# Patient Record
Sex: Female | Born: 1975 | Race: White | Hispanic: No | Marital: Single | Smoking: Former smoker
Health system: Southern US, Community
[De-identification: ages and names within clinical notes are randomized; demographics above are authoritative.]

## PROBLEM LIST (undated history)

## (undated) DIAGNOSIS — N809 Endometriosis, unspecified: Secondary | ICD-10-CM

## (undated) DIAGNOSIS — K589 Irritable bowel syndrome without diarrhea: Secondary | ICD-10-CM

## (undated) DIAGNOSIS — F419 Anxiety disorder, unspecified: Secondary | ICD-10-CM

## (undated) HISTORY — PX: NECK SURGERY: SHX720

---

## 2020-02-28 ENCOUNTER — Other Ambulatory Visit: Payer: Self-pay

## 2020-02-28 ENCOUNTER — Ambulatory Visit: Payer: BC Managed Care – PPO | Attending: Neurosurgery | Admitting: Physical Therapy

## 2020-02-28 ENCOUNTER — Encounter: Payer: Self-pay | Admitting: Physical Therapy

## 2020-02-28 DIAGNOSIS — G8929 Other chronic pain: Secondary | ICD-10-CM | POA: Insufficient documentation

## 2020-02-28 DIAGNOSIS — M542 Cervicalgia: Secondary | ICD-10-CM | POA: Diagnosis not present

## 2020-02-28 DIAGNOSIS — R2681 Unsteadiness on feet: Secondary | ICD-10-CM

## 2020-02-28 DIAGNOSIS — M62838 Other muscle spasm: Secondary | ICD-10-CM

## 2020-02-28 DIAGNOSIS — M545 Low back pain, unspecified: Secondary | ICD-10-CM

## 2020-02-28 NOTE — Therapy (Signed)
[facility] Outpatient Rehabilitation [facility] [address]  [address] [place], [place], [address] Phone: [phone]   Fax:  [phone]  Physical Therapy Evaluation  Patient Details  Name: Cindy Hunt MRN: [id number] Date of Birth: August 28, 1976 Referring Provider (PT): Peggye Ley, MD   Encounter Date: 02/28/2020  PT End of Session - 02/28/20 1416    Visit Number  1    Number of Visits  13    Date for PT Re-Evaluation  04/10/20    Authorization Type  [employer]    PT Start Time  1316    PT Stop Time  1403    PT Time Calculation (min)  47 min    Activity Tolerance  Patient tolerated treatment well    Behavior During Therapy  [facility] for tasks assessed/performed       History reviewed. No pertinent past medical history.  History reviewed. No pertinent surgical history.  There were no vitals filed for this visit.   Subjective Assessment - 02/28/20 1319    Subjective  Patient reports undergoing C5-6 total disc arthroplasty, anterior approach with discectomy on 02/01/19. MD gave no restrictions besides no manipulations such as what a chiropractor would do. Pre-operative symptoms included neck pain and unsteadiness on feet for the past 1/5 years as well as episodes when she felt like she was going to pass out. Post-op she describes another episode of pre-syncope, dizziness, and diaphoresis while driving. Also notes that she felt like she was being pulled to one side. Noticed N/T in B UEs and felt like she was unable to push down on the accelerator with as much force. Has seen many different specialists to figure out what is going on, including an ENT who cleared her for vestibular issues. Looking down for long periods makes her neck feel tight. Has B LBP with prolonged standing which has been an issue for several years. Notes that she still "feels drunk" while walking but notes that she does not appear to look unsteady.    Limitations   Sitting;Reading;Lifting;Standing;Walking;House hold activities    Diagnostic tests  none recent    Patient Stated Goals  "work on stretching"    Currently in Pain?  Yes    Pain Score  7     Pain Location  Neck    Pain Orientation  Right;Left    Pain Descriptors / Indicators  Tightness    Pain Type  Acute pain;Surgical pain    Multiple Pain Sites  Yes    Pain Score  0    Pain Location  Back    Pain Orientation  Left;Right;Lower    Pain Descriptors / Indicators  Aching;Tightness    Pain Type  Chronic pain         [facility] PT Assessment - 02/28/20 1329      Assessment   Medical Diagnosis  Crrvicalgia, chronic LBP    Referring Provider (PT)  Peggye Ley, MD    Onset Date/Surgical Date  02/02/20    Hand Dominance  Right    Next MD Visit  04/21/20    Prior Therapy  yes- pelvic rehab      Precautions   Precautions  None      Balance Screen   Has the patient fallen in the past 6 months  No    Has the patient had a decrease in activity level because of a fear of falling?   No    Is the patient reluctant to leave their home because  of a fear of falling?   No      Home Nurse, mental health  Private residence    Living Arrangements  Parent    Available Help at Discharge  Family    Type of Home  House    Home Access  Stairs to enter    Entrance Stairs-Number of Steps  4    Home Layout  Two level    Alternate Level Stairs-Number of Steps  20    Alternate Level Stairs-Rails  Right    Home Equipment  None      Prior Function   Level of Independence  Independent    Vocation  Full time employment    Vocation Requirements  working from home    Leisure  walking, biking, driving      Cognition   Overall Cognitive Status  Within Functional Limits for tasks assessed      Sensation   Light Touch  Appears Intact   intermittent N/T in B UE/LEs in certain positions     Coordination   Gross Motor Movements are Fluid and Coordinated  Yes      Posture/Postural Control    Posture/Postural Control  Postural limitations    Postural Limitations  Rounded Shoulders;Forward head    Posture Comments  dowager's hump over upper thoracic spine      ROM / Strength   AROM / PROM / Strength  AROM;Strength      AROM   AROM Assessment Site  Cervical;Lumbar    Cervical Flexion  28   severe pain   Cervical Extension  40   severe pain ;4dizziness   Cervical - Right Side Bend  24   severe pain   Cervical - Left Side Bend  30   severe pain   Cervical - Right Rotation  49   severe pain   Cervical - Left Rotation  56   severe pain   Lumbar Flexion  distal shin   severe pain   Lumbar Extension  mildly limited   severe pain   Lumbar - Right Side Bend  distal thigh   severe pain   Lumbar - Left Side Bend  distal thigh   severe pain   Lumbar - Right Rotation  WNL   severe pain   Lumbar - Left Rotation  WNL   severe pain     Strength   Strength Assessment Site  Shoulder;Hip;Knee;Ankle    Right/Left Shoulder  Right;Left    Right Shoulder Flexion  4+/5    Right Shoulder ABduction  4+/5    Right Shoulder Internal Rotation  4+/5    Right Shoulder External Rotation  4+/5    Left Shoulder Flexion  4+/5    Left Shoulder ABduction  4/5    Left Shoulder Internal Rotation  4+/5    Left Shoulder External Rotation  4+/5    Right/Left Hip  Right;Left    Right Hip Flexion  4+/5    Right Hip ABduction  4/5    Right Hip ADduction  4+/5    Left Hip Flexion  4+/5    Left Hip ABduction  4/5    Left Hip ADduction  4+/5    Right/Left Knee  Right;Left    Right Knee Flexion  4+/5    Right Knee Extension  5/5    Left Knee Flexion  4+/5    Left Knee Extension  4+/5    Right/Left Ankle  Right;Left    Right Ankle Dorsiflexion  4+/5    Right Ankle Plantar Flexion  4+/5    Left Ankle Dorsiflexion  4+/5    Left Ankle Plantar Flexion  4+/5      Flexibility   Soft Tissue Assessment /Muscle Length  yes    Hamstrings  B severely tight    Quadriceps  B moderately tight in mod  thomas    ITB  B WNL    Piriformis  B moderately tight in fig 4      Palpation   Palpation comment  no TTP in LB, tightness along thoracic paraspinals and B buttocks; no TTP along neck and shoulders but considerable tone throughout                Objective measurements completed on examination: See above findings.              PT Education - 02/28/20 1416    Education Details  prognosis, POC, HEP    Person(s) Educated  Patient    Methods  Explanation;Demonstration;Tactile cues;Verbal cues;Handout    Comprehension  Verbalized understanding;Returned demonstration       PT Short Term Goals - 02/28/20 1428      PT SHORT TERM GOAL #1   Title  Patient to be independent with initial HEP.    Time  3    Period  Weeks    Status  New    Target Date  03/20/20        PT Long Term Goals - 02/28/20 1429      PT LONG TERM GOAL #1   Title  Patient to be independent with advanced HEP.    Time  6    Period  Weeks    Status  New    Target Date  04/10/20      PT LONG TERM GOAL #2   Title  Patient to demonstrate cervical and lumbar AROM WFL with mild pain remaining.    Time  6    Period  Weeks    Status  New    Target Date  04/10/20      PT LONG TERM GOAL #3   Title  Patient to demonstrate B mild tightness remaining in B HS, piriformis, and hip flexors/quads.    Time  6    Period  Weeks    Status  New    Target Date  04/10/20      PT LONG TERM GOAL #4   Title  Patient to report tolerance for 1.5 hours of reading or working on computer without increase in neck pain in order to improve work tolerance.    Time  6    Period  Weeks    Status  New    Target Date  04/10/20      PT LONG TERM GOAL #5   Title  Patient to score >22/30 on FGA in order to decrease risk of falls.    Time  6    Period  Weeks    Status  New    Target Date  04/10/20             Plan - 02/28/20 1417    Clinical Impression Statement  Patient is a 44y/o F presenting to OPPT with  c/o neck pain and unsteadiness s/p C5-6 total disc arthroplasty, anterior approach with discectomy on 02/01/19 as well as chronic LBP. Patient now dealing with residual tightness in neck and shoulders, especially when looking down. Also notes remaining unsteadiness on her feet resulting from  pre-op cervical disc rupture with myelopathy. Patient is also dealing with episodes of UE N/T, diaphoresis, dizziness, and pre-syncope with activities that also occurred pre-operatively- MD noting this may go away with healing. LBP occurs bilaterally and worse with standing. Patient today demonstrating limited and painful cervical and lumbar AROM, good overall UE and LE strength, rounded shoulders and forward head posture, decreased flexibility, and increased tone in muscle surrounding neck and shoulders as well as back and buttocks. Patient educated on gentle stretching and postural correction HEP- patient reported understanding. Would benefit from skilled PT services 2x/week for 6 weeks to address aforementioned impairments.    Personal Factors and Comorbidities  Age;Past/Current Experience;Profession;Time since onset of injury/illness/exacerbation    Examination-Activity Limitations  Bend;Squat;Carry;Stand;Hygiene/Grooming;Lift;Locomotion Level;Reach Overhead    Examination-Participation Restrictions  Church;Shop;Community Activity;Driving;Yard Work;Laundry;Meal Prep    Stability/Clinical Decision Making  Stable/Uncomplicated    Clinical Decision Making  Low    Rehab Potential  Good    PT Frequency  2x / week    PT Duration  6 weeks    PT Treatment/Interventions  ADLs/Self Care Home Management;Cryotherapy;Electrical Stimulation;Moist Heat;Balance training;Therapeutic exercise;Therapeutic activities;Functional mobility training;Stair training;Gait training;Ultrasound;Neuromuscular re-education;Patient/family education;Manual techniques;Taping;Energy conservation;Dry needling;Passive range of motion;Scar mobilization     PT Next Visit Plan  reassess HEP; FGA    Consulted and Agree with Plan of Care  Patient       Patient will benefit from skilled therapeutic intervention in order to improve the following deficits and impairments:  Decreased activity tolerance, Decreased strength, Increased fascial restricitons, Impaired UE functional use, Pain, Decreased balance, Increased muscle spasms, Decreased range of motion, Postural dysfunction, Impaired flexibility  Visit Diagnosis: Cervicalgia  Chronic bilateral low back pain without sciatica  Other muscle spasm  Unsteadiness on feet     Problem List There are no problems to display for this patient.    Anette Guarneri, PT, DPT 02/28/20 2:33 PM   [facility] [address]  [address] [place], [place], [address] Phone: [phone]   Fax:  [phone]  Name: Cindy Hunt MRN: [id number] Date of Birth: Dec 03, 1976

## 2020-03-02 ENCOUNTER — Ambulatory Visit: Payer: BC Managed Care – PPO

## 2020-03-02 ENCOUNTER — Other Ambulatory Visit: Payer: Self-pay

## 2020-03-02 DIAGNOSIS — G8929 Other chronic pain: Secondary | ICD-10-CM

## 2020-03-02 DIAGNOSIS — M542 Cervicalgia: Secondary | ICD-10-CM

## 2020-03-02 DIAGNOSIS — M62838 Other muscle spasm: Secondary | ICD-10-CM

## 2020-03-02 DIAGNOSIS — M545 Low back pain, unspecified: Secondary | ICD-10-CM

## 2020-03-02 DIAGNOSIS — R2681 Unsteadiness on feet: Secondary | ICD-10-CM

## 2020-03-02 NOTE — Therapy (Signed)
[facility] [address]  [place], [place], [address] Phone: [phone]   Fax:  [phone]  Physical Therapy Treatment  Patient Details  Name: Cindy Hunt MRN: [id number] Date of Birth: 10/21/1976 Referring Provider (PT): Frederich Cha, MD   Encounter Date: 03/02/2020  PT End of Session - 03/02/20 1321    Visit Number  2    Number of Visits  13    Date for PT Re-Evaluation  04/10/20    Authorization Type  [employer]    PT Start Time  1315    PT Stop Time  1401    PT Time Calculation (min)  46 min    Activity Tolerance  Patient tolerated treatment well    Behavior During Therapy  [facility] for tasks assessed/performed       No past medical history on file.  No past surgical history on file.  There were no vitals filed for this visit.  Subjective Assessment - 03/02/20 1317    Subjective  Pt. reporting primary compaint is R lateral shoulder pain.    Diagnostic tests  none recent    Patient Stated Goals  "work on stretching"    Currently in Pain?  Yes    Pain Score  7     Pain Location  Shoulder    Pain Orientation  Right    Pain Descriptors / Indicators  Tightness    Pain Type  Acute pain;Surgical pain    Pain Radiating Towards  Radiating into R lateral neck    Multiple Pain Sites  Yes    Pain Score  0   up to 10/10 for short periods of time at worst   Pain Orientation  Left;Right;Lower    Pain Descriptors / Indicators  Aching;Tightness    Pain Type  Chronic pain    Aggravating Factors   prolonged standing and prolonged sitting    Pain Relieving Factors  short relief with stretching         [facility] PT Assessment - 03/02/20 0001      Functional Gait  Assessment   Gait assessed   Yes    Gait Level Surface  Walks 20 ft in less than 5.5 sec, no assistive devices, good speed, no evidence for imbalance, normal gait pattern, deviates no more than 6 in outside of the 12 in walkway width.    Change in  Gait Speed  Able to smoothly change walking speed without loss of balance or gait deviation. Deviate no more than 6 in outside of the 12 in walkway width.    Gait with Horizontal Head Turns  Performs head turns smoothly with no change in gait. Deviates no more than 6 in outside 12 in walkway width    Gait with Vertical Head Turns  Performs head turns with no change in gait. Deviates no more than 6 in outside 12 in walkway width.    Gait and Pivot Turn  Pivot turns safely within 3 sec and stops quickly with no loss of balance.    Step Over Obstacle  Is able to step over 2 stacked shoe boxes taped together (9 in total height) without changing gait speed. No evidence of imbalance.    Gait with Narrow Base of Support  Is able to ambulate for 10 steps heel to toe with no staggering.    Gait with Eyes Closed  Walks 20 ft, no assistive devices, good speed, no evidence of imbalance, normal gait pattern, deviates  no more than 6 in outside 12 in walkway width. Ambulates 20 ft in less than 7 sec.    Ambulating Backwards  Walks 20 ft, no assistive devices, good speed, no evidence for imbalance, normal gait    Steps  Alternating feet, no rail.    Total Score  30                   [facility] Adult PT Treatment/Exercise - 03/02/20 0001      Neck Exercises: Seated   Neck Retraction  10 reps;5 secs    Neck Retraction Limitations  Good technique     Other Seated Exercise  Seated scapular retraction 5" x 10 reps       Lumbar Exercises: Stretches   Hip Flexor Stretch  Right;Left;2 reps;30 seconds    Hip Flexor Stretch Limitations  mod thomas pos + strap     Piriformis Stretch  Right;Left;2 reps;30 seconds    Piriformis Stretch Limitations  B     Figure 4 Stretch  2 reps;30 seconds    Figure 4 Stretch Limitations  B    Other Lumbar Stretch Exercise  B figure-4 stretch in chair x 30 sec       Lumbar Exercises: Aerobic   Nustep  Lvl 3, 6 min (LE/UE)      Neck Exercises: Stretches   Upper Trapezius  Stretch  Right;Left;1 rep;30 seconds    Upper Trapezius Stretch Limitations  hand anchored on table                PT Short Term Goals - 03/02/20 1321      PT SHORT TERM GOAL #1   Title  Patient to be independent with initial HEP.    Time  3    Period  Weeks    Status  On-going    Target Date  03/20/20        PT Long Term Goals - 03/02/20 1321      PT LONG TERM GOAL #1   Title  Patient to be independent with advanced HEP.    Time  6    Period  Weeks    Status  On-going      PT LONG TERM GOAL #2   Title  Patient to demonstrate cervical and lumbar AROM WFL with mild pain remaining.    Time  6    Period  Weeks    Status  On-going      PT LONG TERM GOAL #3   Title  Patient to demonstrate B mild tightness remaining in B HS, piriformis, and hip flexors/quads.    Time  6    Period  Weeks    Status  On-going      PT LONG TERM GOAL #4   Title  Patient to report tolerance for 1.5 hours of reading or working on computer without increase in neck pain in order to improve work tolerance.    Time  6    Period  Weeks    Status  On-going      PT LONG TERM GOAL #5   Title  Patient to score >22/30 on FGA in order to decrease risk of falls.    Time  6    Period  Weeks    Status  On-going            Plan - 03/02/20 1407    Clinical Impression Statement  Pt. doing well.  Tolerated HEP review requiring very little correction without increased pain.  Did need adjustment to mod Thomas hip flexor stretch to avoid back pain.  Pt. scoring a 30/30 with FGA demonstrating good stability with dynamic gait tasks.  Does verbalize occasional dizziness when looking down while standing or going from supine<>sitting however did not complain of dizziness during session today.  Ended session pain free.    Rehab Potential  Good    PT Treatment/Interventions  ADLs/Self Care Home Management;Cryotherapy;Electrical Stimulation;Moist Heat;Balance training;Therapeutic exercise;Therapeutic  activities;Functional mobility training;Stair training;Gait training;Ultrasound;Neuromuscular re-education;Patient/family education;Manual techniques;Taping;Energy conservation;Dry needling;Passive range of motion;Scar mobilization    Consulted and Agree with Plan of Care  Patient       Patient will benefit from skilled therapeutic intervention in order to improve the following deficits and impairments:  Decreased activity tolerance, Decreased strength, Increased fascial restricitons, Impaired UE functional use, Pain, Decreased balance, Increased muscle spasms, Decreased range of motion, Postural dysfunction, Impaired flexibility  Visit Diagnosis: Cervicalgia  Chronic bilateral low back pain without sciatica  Other muscle spasm  Unsteadiness on feet     Problem List There are no problems to display for this patient.   Kermit Balo, PTA 03/02/20 6:22 PM   [facility] Outpatient Rehabilitation [facility] [address]  [address] [place], [place], [address] Phone: [phone]   Fax:  [phone]  Name: Schylar Allard MRN: [id number] Date of Birth: [birth year]

## 2020-03-06 ENCOUNTER — Other Ambulatory Visit: Payer: Self-pay

## 2020-03-06 ENCOUNTER — Ambulatory Visit: Payer: BC Managed Care – PPO

## 2020-03-06 DIAGNOSIS — M545 Low back pain, unspecified: Secondary | ICD-10-CM

## 2020-03-06 DIAGNOSIS — M542 Cervicalgia: Secondary | ICD-10-CM

## 2020-03-06 DIAGNOSIS — R2681 Unsteadiness on feet: Secondary | ICD-10-CM

## 2020-03-06 DIAGNOSIS — M62838 Other muscle spasm: Secondary | ICD-10-CM

## 2020-03-06 DIAGNOSIS — G8929 Other chronic pain: Secondary | ICD-10-CM

## 2020-03-06 NOTE — Therapy (Signed)
[facility] [address]  [place], [place], [address] Phone: [phone]   Fax:  [phone]  Physical Therapy Treatment  Patient Details  Name: Cindy Hunt MRN: [id number] Date of Birth: 01-Feb-1976 Referring Provider (PT): Frederich Cha, MD   Encounter Date: 03/06/2020  PT End of Session - 03/06/20 1422    Visit Number  3    Number of Visits  13    Date for PT Re-Evaluation  04/10/20    Authorization Type  [employer]    PT Start Time  1405    PT Stop Time  1500    PT Time Calculation (min)  55 min    Activity Tolerance  Patient tolerated treatment well    Behavior During Therapy  [facility] for tasks assessed/performed       No past medical history on file.  No past surgical history on file.  There were no vitals filed for this visit.  Subjective Assessment - 03/06/20 1413    Subjective  Pt. doing well today.  No new complaints.    Diagnostic tests  none recent    Patient Stated Goals  "work on stretching"    Currently in Pain?  Yes    Pain Score  7    10/10 pain on R lateral neck in mornings   Pain Location  Shoulder    Pain Orientation  Right    Pain Descriptors / Indicators  Tightness    Pain Type  Acute pain;Surgical pain    Pain Radiating Towards  into R lateral neck    Multiple Pain Sites  Yes    Pain Location  Back    Pain Orientation  Left;Right;Lower                       [facility] Adult PT Treatment/Exercise - 03/06/20 0001      Neck Exercises: Machines for Strengthening   Cybex Row  low handes; 10# x 15 reps       Neck Exercises: Theraband   Shoulder Extension  10 reps;Red    Shoulder Extension Limitations  cues for scap. retraction/depression     Rows  10 reps;Red    Rows Limitations  cues for scap. retraction/depression       Lumbar Exercises: Stretches   Passive Hamstring Stretch  Right;Left;2 reps;30 seconds    Passive Hamstring Stretch Limitations  supine with  strap     Single Knee to Chest Stretch  Right;Left;1 rep;30 seconds    Single Knee to Chest Stretch Limitations  oppo knee bent    Piriformis Stretch  Right;Left;2 reps;30 seconds    Piriformis Stretch Limitations  B       Lumbar Exercises: Aerobic   UBE (Upper Arm Bike)  Lvl 1.0 ,3 min forwards, 3 min backwards       Modalities   Modalities  Electrical Stimulation;Moist Heat      Moist Heat Therapy   Number Minutes Moist Heat  15 Minutes    Moist Heat Location  Lumbar Spine      Electrical Stimulation   Electrical Stimulation Location  lumbar spine     Electrical Stimulation Action  IFC    Electrical Stimulation Parameters  80-150Hz , intensity to pt. tolerance, 15'    Electrical Stimulation Goals  Tone;Pain      Neck Exercises: Stretches   Upper Trapezius Stretch  Right;Left;1 rep;30 seconds    Upper Trapezius Stretch Limitations  hand anchored on  table     Levator Stretch  Right;1 rep;30 seconds    Corner Stretch  2 reps;30 seconds    Corner Stretch Limitations  low in doorway     Chest Stretch  --               PT Short Term Goals - 03/02/20 1321      PT SHORT TERM GOAL #1   Title  Patient to be independent with initial HEP.    Time  3    Period  Weeks    Status  On-going    Target Date  03/20/20        PT Long Term Goals - 03/02/20 1321      PT LONG TERM GOAL #1   Title  Patient to be independent with advanced HEP.    Time  6    Period  Weeks    Status  On-going      PT LONG TERM GOAL #2   Title  Patient to demonstrate cervical and lumbar AROM WFL with mild pain remaining.    Time  6    Period  Weeks    Status  On-going      PT LONG TERM GOAL #3   Title  Patient to demonstrate B mild tightness remaining in B HS, piriformis, and hip flexors/quads.    Time  6    Period  Weeks    Status  On-going      PT LONG TERM GOAL #4   Title  Patient to report tolerance for 1.5 hours of reading or working on computer without increase in neck pain in order  to improve work tolerance.    Time  6    Period  Weeks    Status  On-going      PT LONG TERM GOAL #5   Title  Patient to score >22/30 on FGA in order to decrease risk of falls.    Time  6    Period  Weeks    Status  On-going            Plan - 03/06/20 1423    Clinical Impression Statement  Cindy Hunt felt fine after last session.  Progressed postural/scapular strengthening today with cueing required for scapular depression/retraction at times.  Split time in session today between cervical and lumbar focused strengthening and stretching activities.  Ended visit with pt. reporting neck and back pain thus trialed E-stim to lumbar spine with moist heat to lumbar/thoracic/cervical spine in hooklying.  Pt. leaving session reporting relief.    Rehab Potential  Good    PT Treatment/Interventions  ADLs/Self Care Home Management;Cryotherapy;Electrical Stimulation;Moist Heat;Balance training;Therapeutic exercise;Therapeutic activities;Functional mobility training;Stair training;Gait training;Ultrasound;Neuromuscular re-education;Patient/family education;Manual techniques;Taping;Energy conservation;Dry needling;Passive range of motion;Scar mobilization    PT Next Visit Plan  Monitor tolerance to modalities    Consulted and Agree with Plan of Care  Patient       Patient will benefit from skilled therapeutic intervention in order to improve the following deficits and impairments:  Decreased activity tolerance, Decreased strength, Increased fascial restricitons, Impaired UE functional use, Pain, Decreased balance, Increased muscle spasms, Decreased range of motion, Postural dysfunction, Impaired flexibility  Visit Diagnosis: Cervicalgia  Chronic bilateral low back pain without sciatica  Other muscle spasm  Unsteadiness on feet     Problem List There are no problems to display for this patient.   Kermit Balo, PTA 03/06/20 6:21 PM   [facility] [address]  [address]  [address] [place], [place], [address] Phone: [phone]   Fax:  [phone]  Name: Cindy Hunt MRN: [id number] Date of Birth: October 16, 1976

## 2020-03-09 ENCOUNTER — Other Ambulatory Visit: Payer: Self-pay

## 2020-03-09 ENCOUNTER — Ambulatory Visit: Payer: BC Managed Care – PPO

## 2020-03-09 DIAGNOSIS — G8929 Other chronic pain: Secondary | ICD-10-CM

## 2020-03-09 DIAGNOSIS — M542 Cervicalgia: Secondary | ICD-10-CM | POA: Diagnosis not present

## 2020-03-09 DIAGNOSIS — M62838 Other muscle spasm: Secondary | ICD-10-CM

## 2020-03-09 DIAGNOSIS — R2681 Unsteadiness on feet: Secondary | ICD-10-CM

## 2020-03-09 DIAGNOSIS — M545 Low back pain, unspecified: Secondary | ICD-10-CM

## 2020-03-09 NOTE — Therapy (Signed)
[facility] [address]  [place], [place], [address] Phone: [phone]   Fax:  [phone]  Physical Therapy Treatment  Patient Details  Name: Cindy Hunt MRN: [id number] Date of Birth: 04/27/1976 Referring Provider (PT): Frederich Cha, MD   Encounter Date: 03/09/2020  PT End of Session - 03/09/20 1324    Visit Number  4    Number of Visits  13    Date for PT Re-Evaluation  04/10/20    Authorization Type  [employer]    PT Start Time  1316    PT Stop Time  1357    PT Time Calculation (min)  41 min    Activity Tolerance  Patient tolerated treatment well    Behavior During Therapy  [facility] for tasks assessed/performed       No past medical history on file.  No past surgical history on file.  There were no vitals filed for this visit.  Subjective Assessment - 03/09/20 1320    Subjective  Pt. reporting that she has pinpointed her pain levels to proximal R-sided upper neck and lower back "over butt bone".    Diagnostic tests  none recent    Patient Stated Goals  "work on stretching"    Currently in Pain?  Yes    Pain Score  5     Pain Location  Neck    Pain Orientation  Right;Upper    Pain Descriptors / Indicators  Tightness    Pain Type  Acute pain;Surgical pain    Pain Radiating Towards  radiating into R upper shoulder    Pain Onset  More than a month ago    Pain Frequency  Intermittent    Multiple Pain Sites  Yes    Pain Score  7    Pain Location  Back    Pain Orientation  Left;Right;Lower    Pain Descriptors / Indicators  Aching;Tightness    Pain Type  Chronic pain                       [facility] Adult PT Treatment/Exercise - 03/09/20 0001      Neck Exercises: Machines for Strengthening   Cybex Row  low handes; 10# x 15 reps       Lumbar Exercises: Stretches   Single Knee to Chest Stretch  Right;Left;1 rep;30 seconds    Single Knee to Chest Stretch Limitations  oppo knee bent     Lower Trunk Rotation Limitations  5" x 10 rpes       Lumbar Exercises: Aerobic   UBE (Upper Arm Bike)  Lvl 1.0 ,3 min forwards, 3 min backwards       Lumbar Exercises: Supine   Pelvic Tilt  10 reps;5 seconds    Pelvic Tilt Limitations  Cues for proper motion     Dead Bug  10 reps;3 seconds    Dead Bug Limitations  1st set LE only; 2nd set UE/LE from hooklying       Neck Exercises: Stretches   Upper Trapezius Stretch  Right;Left;1 rep;30 seconds    Upper Trapezius Stretch Limitations  hand anchored on table     Levator Stretch  Right;1 rep;30 seconds    Levator Stretch Limitations  hands anchored     Neck Stretch  1 rep;30 seconds    Neck Stretch Limitations  B scalenes stretch x 30 sec     Other Neck Stretches  B SCM stretch x 30  sec              PT Education - 03/09/20 1401    Education Details  HEP update; Deadbug, Cat/Camal    Person(s) Educated  Patient    Methods  Explanation;Demonstration;Verbal cues;Handout    Comprehension  Verbalized understanding;Returned demonstration;Verbal cues required       PT Short Term Goals - 03/09/20 1325      PT SHORT TERM GOAL #1   Title  Patient to be independent with initial HEP.    Time  3    Period  Weeks    Status  Achieved    Target Date  03/20/20        PT Long Term Goals - 03/02/20 1321      PT LONG TERM GOAL #1   Title  Patient to be independent with advanced HEP.    Time  6    Period  Weeks    Status  On-going      PT LONG TERM GOAL #2   Title  Patient to demonstrate cervical and lumbar AROM WFL with mild pain remaining.    Time  6    Period  Weeks    Status  On-going      PT LONG TERM GOAL #3   Title  Patient to demonstrate B mild tightness remaining in B HS, piriformis, and hip flexors/quads.    Time  6    Period  Weeks    Status  On-going      PT LONG TERM GOAL #4   Title  Patient to report tolerance for 1.5 hours of reading or working on computer without increase in neck pain in order to  improve work tolerance.    Time  6    Period  Weeks    Status  On-going      PT LONG TERM GOAL #5   Title  Patient to score >22/30 on FGA in order to decrease risk of falls.    Time  6    Period  Weeks    Status  On-going            Plan - 03/09/20 1326    Clinical Impression Statement  Pt. reporting she is getting to home exercise program 5x/week on days not in therapy.  Denies questions regarding HEP.  STG #1 met.  Primary concern with R-sided neck pain and B lower back pain.  Progressed lumbopelvic ROM and strengthening with focus on abdom. activation as pt. doing heady physical activity with yardwork over last few days at her parents house.  HEP updated with lumbopelvic ROM and strengthening that pt. tolerated well in session.  Ended visit with modalities deferred as pt. pain free to end session.  Pt. reporting she did have good pain relief last session from moist heat and E-stim to B lumbar spine and may consider this for pain relief in coming sessions.  Will also plan to review updated HEP to check for tolerance in coming sessions.    Rehab Potential  Good    PT Treatment/Interventions  ADLs/Self Care Home Management;Cryotherapy;Electrical Stimulation;Moist Heat;Balance training;Therapeutic exercise;Therapeutic activities;Functional mobility training;Stair training;Gait training;Ultrasound;Neuromuscular re-education;Patient/family education;Manual techniques;Taping;Energy conservation;Dry needling;Passive range of motion;Scar mobilization    PT Next Visit Plan  Monitor tolerance to updated HEP; modalties prn for lumbar and neck pain relief; postural/cervical flexibility and strengthening    Consulted and Agree with Plan of Care  Patient       Patient will benefit from skilled therapeutic intervention in  order to improve the following deficits and impairments:  Decreased activity tolerance, Decreased strength, Increased fascial restricitons, Impaired UE functional use, Pain, Decreased  balance, Increased muscle spasms, Decreased range of motion, Postural dysfunction, Impaired flexibility  Visit Diagnosis: Cervicalgia  Chronic bilateral low back pain without sciatica  Other muscle spasm  Unsteadiness on feet     Problem List There are no problems to display for this patient.   Bess Harvest, PTA 03/09/20 2:15 PM   [facility] [address]  [place], [place], [address] Phone: [phone]   Fax:  [phone]  Name: Tanise Russman MRN: [id number] Date of Birth: 1976/12/18

## 2020-03-13 ENCOUNTER — Other Ambulatory Visit: Payer: Self-pay

## 2020-03-13 ENCOUNTER — Ambulatory Visit: Payer: BC Managed Care – PPO

## 2020-03-13 DIAGNOSIS — G8929 Other chronic pain: Secondary | ICD-10-CM

## 2020-03-13 DIAGNOSIS — M545 Low back pain, unspecified: Secondary | ICD-10-CM

## 2020-03-13 DIAGNOSIS — M542 Cervicalgia: Secondary | ICD-10-CM | POA: Diagnosis not present

## 2020-03-13 DIAGNOSIS — R2681 Unsteadiness on feet: Secondary | ICD-10-CM

## 2020-03-13 DIAGNOSIS — M62838 Other muscle spasm: Secondary | ICD-10-CM

## 2020-03-13 NOTE — Therapy (Signed)
[facility] [address]  [place], [place], [address] Phone: [phone]   Fax:  [phone]  Physical Therapy Treatment  Patient Details  Name: Cindy Hunt MRN: [id number] Date of Birth: [birth year] Referring Provider (PT): Frederich Cha, MD   Encounter Date: 03/13/2020  PT End of Session - 03/13/20 1323    Visit Number  5    Number of Visits  13    Date for PT Re-Evaluation  04/10/20    Authorization Type  [employer]    PT Start Time  5852    PT Stop Time  1423    PT Time Calculation (min)  60 min    Activity Tolerance  Patient tolerated treatment well    Behavior During Therapy  [facility] for tasks assessed/performed       History reviewed. No pertinent past medical history.  History reviewed. No pertinent surgical history.  There were no vitals filed for this visit.  Subjective Assessment - 03/13/20 1328    Subjective  Pt reports she saw a massage therapist on Saturday that found tension in her suboccipitals with improvement in vision post suboccipital release, even though it worsened during the release. 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 Her headache is worse today.    Diagnostic tests  none recent    Patient Stated Goals  "work on stretching"    Currently in Pain?  Yes    Pain Score  6     Pain Location  Neck    Pain Orientation  Left;Right;Mid    Pain Descriptors / Indicators  Tightness    Pain Type  Acute pain;Surgical pain    Pain Radiating Towards  radiating pain upward causing HA    Pain Onset  More than a month ago    Pain Frequency  Intermittent    Pain Relieving Factors  Extension         [facility] PT Assessment - 03/13/20 0001      AROM   Cervical Flexion  40    Cervical Extension  40    Cervical - Right Side Bend  31    Cervical - Left Side Bend  31    Cervical - Right Rotation  70    Cervical - Left Rotation  78                   [facility] Adult PT  Treatment/Exercise - 03/13/20 0001      Exercises   Exercises  Neck;Shoulder      Neck Exercises: Seated   Neck Retraction  --   Prone windshield wipers     Neck Exercises: Prone   Neck Retraction  10 reps;3 secs    Neck Retraction Limitations  goal post arms/windshield wipers      Shoulder Exercises: Prone   Retraction  AROM;Strengthening;Both;10 reps;Other (comment)    Retraction Limitations  5" holds, neck relaxed, palms facing down wiith facilitation of scap retraction and depression on mat table      Shoulder Exercises: Stretch   Other Shoulder Stretches  Foam roller thoracic extension 10 x 5"       Manual Therapy   Manual Therapy  Joint mobilization;Soft tissue mobilization;Myofascial release;Manual Traction    Joint Mobilization  UPAs grade II R upper C/S, CPAs and rib rocking grade III upper T/S    Soft tissue mobilization  suboccipital release    Myofascial Release  upper thoracic b/t shoulder blades    Manual  Traction  gentle traction with SO release      Neck Exercises: Stretches   Other Neck Stretches  SO release on FR in H/L             PT Education - 03/13/20 1440    Education Details  HEP update wiht handout; prone cervical retractions with windshield wiper arms, prone scapular retractions, foam roller SO release and T/S extension    Person(s) Educated  Patient    Methods  Explanation;Demonstration;Handout;Verbal cues;Tactile cues    Comprehension  Verbalized understanding;Returned demonstration;Verbal cues required       PT Short Term Goals - 03/09/20 1325      PT SHORT TERM GOAL #1   Title  Patient to be independent with initial HEP.    Time  3    Period  Weeks    Status  Achieved    Target Date  03/20/20        PT Long Term Goals - 03/02/20 1321      PT LONG TERM GOAL #1   Title  Patient to be independent with advanced HEP.    Time  6    Period  Weeks    Status  On-going      PT LONG TERM GOAL #2   Title  Patient to demonstrate  cervical and lumbar AROM WFL with mild pain remaining.    Time  6    Period  Weeks    Status  On-going      PT LONG TERM GOAL #3   Title  Patient to demonstrate B mild tightness remaining in B HS, piriformis, and hip flexors/quads.    Time  6    Period  Weeks    Status  On-going      PT LONG TERM GOAL #4   Title  Patient to report tolerance for 1.5 hours of reading or working on computer without increase in neck pain in order to improve work tolerance.    Time  6    Period  Weeks    Status  On-going      PT LONG TERM GOAL #5   Title  Patient to score >22/30 on FGA in order to decrease risk of falls.    Time  6    Period  Weeks    Status  On-going            Plan - 03/13/20 1441    Clinical Impression Statement  Pt presents with continued c/o tightness with a revelation from her massage therapist that her suboccipitals are connected to her vision. During SO release, pt reports some "buggy-eyed, blurred vision" that returns to baseline/improves following release. Pt tolerated tx well, noting improvement with T/S extension over chair, prone cervical/scapular retractions and SO release as well as supine thoracic extensions over foam roller. Pt educated on possibility of hypertonicity related to need for cervical stabilization, as well as postural deficits and poor cervical posturing. Pt educated to perform HEP and focus on upright posture with alarms set on her phone as reminders and to report to PT/PTA next visit.    Rehab Potential  Good    PT Treatment/Interventions  ADLs/Self Care Home Management;Cryotherapy;Electrical Stimulation;Moist Heat;Balance training;Therapeutic exercise;Therapeutic activities;Functional mobility training;Stair training;Gait training;Ultrasound;Neuromuscular re-education;Patient/family education;Manual techniques;Taping;Energy conservation;Dry needling;Passive range of motion;Scar mobilization    PT Next Visit Plan  Monitor progress from HEP and continue to  progress cervical/core stabilization and strength, as well as thoracic mobility and spinal alignment.    Consulted and Agree  with Plan of Care  Patient       Patient will benefit from skilled therapeutic intervention in order to improve the following deficits and impairments:  Decreased activity tolerance, Decreased strength, Increased fascial restricitons, Impaired UE functional use, Pain, Decreased balance, Increased muscle spasms, Decreased range of motion, Postural dysfunction, Impaired flexibility  Visit Diagnosis: Cervicalgia  Chronic bilateral low back pain without sciatica  Other muscle spasm  Unsteadiness on feet     Problem List There are no problems to display for this patient.   Marcelline Mates, PT, DPT 03/13/2020, 2:47 PM  [facility] [address]  [address] [place], [place], [address] Phone: [phone]   Fax:  [phone]  Name: Taytem Ghattas MRN: [id number] Date of Birth: 05-Feb-1976

## 2020-03-16 ENCOUNTER — Other Ambulatory Visit: Payer: Self-pay

## 2020-03-16 ENCOUNTER — Ambulatory Visit: Payer: BC Managed Care – PPO

## 2020-03-16 DIAGNOSIS — M545 Low back pain, unspecified: Secondary | ICD-10-CM

## 2020-03-16 DIAGNOSIS — G8929 Other chronic pain: Secondary | ICD-10-CM

## 2020-03-16 DIAGNOSIS — M542 Cervicalgia: Secondary | ICD-10-CM

## 2020-03-16 DIAGNOSIS — M62838 Other muscle spasm: Secondary | ICD-10-CM

## 2020-03-16 DIAGNOSIS — R2681 Unsteadiness on feet: Secondary | ICD-10-CM

## 2020-03-16 NOTE — Therapy (Signed)
[facility] [address]  [address] [place], [place], [address] Phone: [phone]   Fax:  [phone]  Physical Therapy Treatment  Patient Details  Name: Cindy Hunt MRN: [id number] Date of Birth: 15-Aug-1976 Referring Provider (PT): Peggye Ley, MD   Encounter Date: 03/16/2020  PT End of Session - 03/16/20 1329    Visit Number  6    Number of Visits  13    Date for PT Re-Evaluation  04/10/20    Authorization Type  [employer]    PT Start Time  1320    PT Stop Time  1400    PT Time Calculation (min)  40 min    Activity Tolerance  Patient tolerated treatment well    Behavior During Therapy  [facility] for tasks assessed/performed       No past medical history on file.  No past surgical history on file.  There were no vitals filed for this visit.  Subjective Assessment - 03/16/20 1327    Subjective  Pt. reporting MD diagnosed her with Vit B and Vit D deficiency.    Diagnostic tests  none recent    Patient Stated Goals  "work on stretching"    Currently in Pain?  No/denies    Pain Score  0-No pain    Multiple Pain Sites  No                       [facility] Adult PT Treatment/Exercise - 03/16/20 0001      Neck Exercises: Machines for Strengthening   UBE (Upper Arm Bike)  Lvl 2.5, 3 min forwards, 3 min backwards       Lumbar Exercises: Machines for Strengthening   Other Lumbar Machine Exercise  B machine pallof press 5# x 10 reps       Lumbar Exercises: Standing   Other Standing Lumbar Exercises  B standing red TB SLS pallof press x 10 reps each       Lumbar Exercises: Sidelying   Other Sidelying Lumbar Exercises  B "open book" stretch 5" x 10 resp       Lumbar Exercises: Quadruped   Madcat/Old Horse  10 reps    Madcat/Old Horse Limitations  cat/camal     Opposite Arm/Leg Raise  10 reps;Right arm/Left leg;Left arm/Right leg    Opposite Arm/Leg Raise Limitations  quadruped       Knee/Hip  Exercises: Standing   SLS with Vectors  B SLS + pallof press with red TB x 10 resp    cues for technique and abdom. bracing      Neck Exercises: Stretches   Corner Stretch  2 reps;30 seconds    Corner Stretch Limitations  mid in doorway single arm each                PT Short Term Goals - 03/09/20 1325      PT SHORT TERM GOAL #1   Title  Patient to be independent with initial HEP.    Time  3    Period  Weeks    Status  Achieved    Target Date  03/20/20        PT Long Term Goals - 03/16/20 1337      PT LONG TERM GOAL #1   Title  Patient to be independent with advanced HEP.    Time  6    Period  Weeks    Status  On-going  PT LONG TERM GOAL #2   Title  Patient to demonstrate cervical and lumbar AROM WFL with mild pain remaining.    Time  6    Period  Weeks    Status  On-going      PT LONG TERM GOAL #3   Title  Patient to demonstrate B mild tightness remaining in B HS, piriformis, and hip flexors/quads.    Time  6    Period  Weeks    Status  On-going      PT LONG TERM GOAL #4   Title  Patient to report tolerance for 1.5 hours of reading or working on computer without increase in neck pain in order to improve work tolerance.    Time  6    Period  Weeks    Status  On-going      PT LONG TERM GOAL #5   Title  Patient to score >22/30 on FGA in order to decrease risk of falls.    Time  6    Period  Weeks    Status  Achieved            Plan - 03/16/20 1336    Clinical Impression Statement  Pt. doing well today.  Notes she is getting a massage tomorrow.  Tolerated progression of lumbopelvic strengthening and stability activities well.  Did not have pain with therex today.    Rehab Potential  Good    PT Treatment/Interventions  ADLs/Self Care Home Management;Cryotherapy;Electrical Stimulation;Moist Heat;Balance training;Therapeutic exercise;Therapeutic activities;Functional mobility training;Stair training;Gait training;Ultrasound;Neuromuscular  re-education;Patient/family education;Manual techniques;Taping;Energy conservation;Dry needling;Passive range of motion;Scar mobilization    PT Next Visit Plan  Monitor progress from HEP and continue to progress cervical/core stabilization and strength, as well as thoracic mobility and spinal alignment.    Consulted and Agree with Plan of Care  Patient       Patient will benefit from skilled therapeutic intervention in order to improve the following deficits and impairments:  Decreased activity tolerance, Decreased strength, Increased fascial restricitons, Impaired UE functional use, Pain, Decreased balance, Increased muscle spasms, Decreased range of motion, Postural dysfunction, Impaired flexibility  Visit Diagnosis: Cervicalgia  Chronic bilateral low back pain without sciatica  Other muscle spasm  Unsteadiness on feet     Problem List There are no problems to display for this patient.   Bess Harvest, PTA 03/16/20 5:26 PM   [facility] [address]  [place], [place], [address] Phone: [phone]   Fax:  [phone]  Name: Cindy Hunt MRN: [id number] Date of Birth: 05/21/1976

## 2020-03-20 ENCOUNTER — Encounter: Payer: Self-pay | Admitting: Physical Therapy

## 2020-03-20 ENCOUNTER — Other Ambulatory Visit: Payer: Self-pay

## 2020-03-20 ENCOUNTER — Ambulatory Visit: Payer: BC Managed Care – PPO | Admitting: Physical Therapy

## 2020-03-20 DIAGNOSIS — G8929 Other chronic pain: Secondary | ICD-10-CM

## 2020-03-20 DIAGNOSIS — R2681 Unsteadiness on feet: Secondary | ICD-10-CM

## 2020-03-20 DIAGNOSIS — M545 Low back pain, unspecified: Secondary | ICD-10-CM

## 2020-03-20 DIAGNOSIS — M542 Cervicalgia: Secondary | ICD-10-CM

## 2020-03-20 DIAGNOSIS — M62838 Other muscle spasm: Secondary | ICD-10-CM

## 2020-03-20 NOTE — Therapy (Signed)
[facility] [address]  [address] [place], [place], [address] Phone: [phone]   Fax:  [phone]  Physical Therapy Treatment  Patient Details  Name: Cindy Hunt MRN: [id number] Date of Birth: 1976/09/29 Referring Provider (PT): Peggye Ley, MD   Encounter Date: 03/20/2020  PT End of Session - 03/20/20 1401    Visit Number  7    Number of Visits  13    Date for PT Re-Evaluation  04/10/20    Authorization Type  [employer]    PT Start Time  1317    PT Stop Time  1409    PT Time Calculation (min)  52 min    Activity Tolerance  Patient tolerated treatment well    Behavior During Therapy  [facility] for tasks assessed/performed       History reviewed. No pertinent past medical history.  History reviewed. No pertinent surgical history.  There were no vitals filed for this visit.  Subjective Assessment - 03/20/20 1318    Subjective  Started back to work today and can tell that she is not sitting right because she feels like the L side of her neck has locked up. LB always feels like "it needs to pop into place." Noting low vitamin B and D levels and is taking supplementation for this.    Diagnostic tests  none recent    Patient Stated Goals  "work on stretching"    Currently in Pain?  Yes    Pain Score  7     Pain Location  Neck    Pain Orientation  Left    Pain Descriptors / Indicators  Tightness   strain   Pain Type  Acute pain;Surgical pain    Pain Score  7    Pain Location  Back    Pain Orientation  Right;Left;Lower    Pain Descriptors / Indicators  Aching;Tightness    Pain Type  Chronic pain                       [facility] Adult PT Treatment/Exercise - 03/20/20 0001      Self-Care   Self-Care  Other Self-Care Comments    Other Self-Care Comments   edu and practice using self-STM using ball on L suboccipitals      Neck Exercises: Seated   Cervical Rotation  Left;10 reps    Cervical  Rotation Limitations  cervical rotation SNAG   cues to avoid pushing into pain; cues for positioning   Other Seated Exercise  R/L suboccipitals self-stretch to tolerance 2x20"    cues to avoid piushing into pain     Lumbar Exercises: Aerobic   Stationary Bike  L2 x 6 min       Modalities   Modalities  Cryotherapy      Cryotherapy   Number Minutes Cryotherapy  10 Minutes    Cryotherapy Location  Cervical    Type of Cryotherapy  Ice pack      Manual Therapy   Manual Therapy  Soft tissue mobilization;Myofascial release    Manual therapy comments  seated    Soft tissue mobilization  STM and IASTM to L UT, LS, cervical paraspinals, and suboccipitals, scalenes-      Myofascial Release  manual TPR to L suboccipitals, scalenes, LS             PT Education - 03/20/20 1401    Education Details  update to HEP; edu on benefits of  DN    Person(s) Educated  Patient    Methods  Explanation;Demonstration;Tactile cues;Verbal cues;Handout    Comprehension  Verbalized understanding;Returned demonstration       PT Short Term Goals - 03/09/20 1325      PT SHORT TERM GOAL #1   Title  Patient to be independent with initial HEP.    Time  3    Period  Weeks    Status  Achieved    Target Date  03/20/20        PT Long Term Goals - 03/16/20 1337      PT LONG TERM GOAL #1   Title  Patient to be independent with advanced HEP.    Time  6    Period  Weeks    Status  On-going      PT LONG TERM GOAL #2   Title  Patient to demonstrate cervical and lumbar AROM WFL with mild pain remaining.    Time  6    Period  Weeks    Status  On-going      PT LONG TERM GOAL #3   Title  Patient to demonstrate B mild tightness remaining in B HS, piriformis, and hip flexors/quads.    Time  6    Period  Weeks    Status  On-going      PT LONG TERM GOAL #4   Title  Patient to report tolerance for 1.5 hours of reading or working on computer without increase in neck pain in order to improve work  tolerance.    Time  6    Period  Weeks    Status  On-going      PT LONG TERM GOAL #5   Title  Patient to score >22/30 on FGA in order to decrease risk of falls.    Time  6    Period  Weeks    Status  Achieved            Plan - 03/20/20 1402    Clinical Impression Statement  Patient reporting increase in L sided neck pain today as she returned to work and feels that her chair is requiring her to sit in an uncomfortable position. Provided STM/IASTM and TPR to L upper shoulder and cervical musculature. Patient demonstrated increased soft tissue restriction and trigger points in L LS, UT, scalenes, and suboccipitals. Educated patient on DN as this may be a beneficial modality for the patient. Worked on gentle suboccipital stretching as well as gentle cervical SNAGs to patient's tolerance- patient reported no worsening of pain. Ended session with icepack to neck to address remaining pain. No complaints at end of session.    Rehab Potential  Good    PT Treatment/Interventions  ADLs/Self Care Home Management;Cryotherapy;Electrical Stimulation;Moist Heat;Balance training;Therapeutic exercise;Therapeutic activities;Functional mobility training;Stair training;Gait training;Ultrasound;Neuromuscular re-education;Patient/family education;Manual techniques;Taping;Energy conservation;Dry needling;Passive range of motion;Scar mobilization    PT Next Visit Plan  Monitor progress from HEP and continue to progress cervical/core stabilization and strength, as well as thoracic mobility and spinal alignment.    Consulted and Agree with Plan of Care  Patient       Patient will benefit from skilled therapeutic intervention in order to improve the following deficits and impairments:  Decreased activity tolerance, Decreased strength, Increased fascial restricitons, Impaired UE functional use, Pain, Decreased balance, Increased muscle spasms, Decreased range of motion, Postural dysfunction, Impaired  flexibility  Visit Diagnosis: Cervicalgia  Chronic bilateral low back pain without sciatica  Other muscle spasm  Unsteadiness on feet  Problem List There are no problems to display for this patient.    Janene Harvey, PT, DPT 03/20/20 4:22 PM   [facility] [address]  [place], [place], [address] Phone: [phone]   Fax:  [phone]  Name: Shelsea Hangartner MRN: [id number] Date of Birth: [birth year]

## 2020-03-20 NOTE — Patient Instructions (Addendum)

## 2020-03-23 ENCOUNTER — Ambulatory Visit: Payer: BC Managed Care – PPO

## 2020-03-23 ENCOUNTER — Other Ambulatory Visit: Payer: Self-pay

## 2020-03-23 DIAGNOSIS — G8929 Other chronic pain: Secondary | ICD-10-CM

## 2020-03-23 DIAGNOSIS — M62838 Other muscle spasm: Secondary | ICD-10-CM

## 2020-03-23 DIAGNOSIS — M545 Low back pain, unspecified: Secondary | ICD-10-CM

## 2020-03-23 DIAGNOSIS — M542 Cervicalgia: Secondary | ICD-10-CM

## 2020-03-23 DIAGNOSIS — R2681 Unsteadiness on feet: Secondary | ICD-10-CM

## 2020-03-23 NOTE — Therapy (Signed)
[facility] [address]  [place], [place], [address] Phone: [phone]   Fax:  [phone]  Physical Therapy Treatment  Patient Details  Name: Cindy Hunt MRN: [id number] Date of Birth: September 12, 1976 Referring Provider (PT): Frederich Cha, MD   Encounter Date: 03/23/2020  PT End of Session - 03/23/20 1328    Visit Number  8    Number of Visits  13    Date for PT Re-Evaluation  04/10/20    Authorization Type  [employer]    PT Start Time  1318    PT Stop Time  1408    PT Time Calculation (min)  50 min    Activity Tolerance  Patient tolerated treatment well    Behavior During Therapy  [facility] for tasks assessed/performed       No past medical history on file.  No past surgical history on file.  There were no vitals filed for this visit.  Subjective Assessment - 03/23/20 1324    Subjective  Pt. 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  Yes    Pain Score  6     Pain Location  Shoulder    Pain Orientation  Right    Pain Descriptors / Indicators  Aching    Pain Type  Acute pain;Surgical pain    Pain Onset  More than a month ago    Pain Frequency  Constant                       [facility] Adult PT Treatment/Exercise - 03/23/20 0001      Self-Care   Self-Care  Other Self-Care Comments    Other Self-Care Comments   B rhomboids STM with ball on wall       Neck Exercises: Machines for Strengthening   UBE (Upper Arm Bike)  Lvl 2.5, 3 min forwards, 3 min backwards       Neck Exercises: Theraband   Rows  10 reps;Green    Rows Limitations  cues for scap. retraction/depression       Lumbar Exercises: Stretches   Hip Flexor Stretch  Right;Left;2 reps;30 seconds    Hip Flexor Stretch Limitations  mod thomas pos + strap     Lumbar Stabilization Level 1  3 reps;20 seconds    Lumbar  Stabilization Level 1 Limitations  Green p-ball seated rollouts with lumbar stretch       Lumbar Exercises: Quadruped   Madcat/Old Horse  10 reps    Madcat/Old Horse Limitations  cat/camal in quaruped     Opposite Arm/Leg Raise  10 reps;Right arm/Left leg;Left arm/Right leg    Opposite Arm/Leg Raise Limitations  quadruped     Other Quadruped Lumbar Exercises  B "thread the needle" 3" x 10 reps       Neck Exercises: Stretches   Corner Stretch  2 reps;30 seconds    Corner Stretch Limitations  mid in doorway single arm each     Other Neck Stretches  Rhomboids stretch 2 x 30 sec              PT Education - 03/23/20 1428    Education Details  HEP update;  doorway pec stretch, quadruped alt LE/UE raise, green TB row, green TB extension    Person(s) Educated  Patient  Methods  Explanation;Demonstration;Verbal cues;Handout    Comprehension  Verbalized understanding;Returned demonstration;Verbal cues required       PT Short Term Goals - 03/09/20 1325      PT SHORT TERM GOAL #1   Title  Patient to be independent with initial HEP.    Time  3    Period  Weeks    Status  Achieved    Target Date  03/20/20        PT Long Term Goals - 03/23/20 1353      PT LONG TERM GOAL #1   Title  Patient to be independent with advanced HEP.    Time  6    Period  Weeks    Status  On-going      PT LONG TERM GOAL #2   Title  Patient to demonstrate cervical and lumbar AROM WFL with mild pain remaining.    Time  6    Period  Weeks    Status  On-going      PT LONG TERM GOAL #3   Title  Patient to demonstrate B mild tightness remaining in B HS, piriformis, and hip flexors/quads.    Time  6    Period  Weeks    Status  On-going      PT LONG TERM GOAL #4   Title  Patient to report tolerance for 1.5 hours of reading or working on computer without increase in neck pain in order to improve work tolerance.    Time  6    Period  Weeks    Status  On-going   03/23/20 - 40 min before increased  neck pain     PT LONG TERM GOAL #5   Title  Patient to score >22/30 on FGA in order to decrease risk of falls.    Time  6    Period  Weeks    Status  Achieved            Plan - 03/23/20 1421    Clinical Impression Statement  Cindy Hunt reporting she is still having intermittent neck pain and LBP after workday which primarily consists of sitting leaning forward in desk chair.  Did discuss various stretching/postural strengthening activities aimed at addressing sitting desk posture (hip flexor stretches, scapular band row, quadruped LE/UE raise).  Pt. tolerated these activities well without pain thus updated HEP handout issued to pt.  Pt. encouraged to alternate doing "one half of HEP activities one day, and other half of HEP activities the next day" as to avoid excessive time commitment with pt. verbalizing understanding.  Ended visit pain free.    Rehab Potential  Good    PT Treatment/Interventions  ADLs/Self Care Home Management;Cryotherapy;Electrical Stimulation;Moist Heat;Balance training;Therapeutic exercise;Therapeutic activities;Functional mobility training;Stair training;Gait training;Ultrasound;Neuromuscular re-education;Patient/family education;Manual techniques;Taping;Energy conservation;Dry needling;Passive range of motion;Scar mobilization    PT Next Visit Plan  Monitor progress from HEP and continue to progress cervical/core stabilization and strength, as well as thoracic mobility and spinal alignment.    Consulted and Agree with Plan of Care  Patient       Patient will benefit from skilled therapeutic intervention in order to improve the following deficits and impairments:  Decreased activity tolerance, Decreased strength, Increased fascial restricitons, Impaired UE functional use, Pain, Decreased balance, Increased muscle spasms, Decreased range of motion, Postural dysfunction, Impaired flexibility  Visit Diagnosis: Cervicalgia  Chronic bilateral low back pain without  sciatica  Other muscle spasm  Unsteadiness on feet     Problem List There are no  problems to display for this patient.  Kermit Balo, PTA 03/23/20 2:33 PM    [facility] [address]  [address] [place], [place], [address] Phone: [phone]   Fax:  [phone]  Name: Cindy Hunt MRN: [id number] Date of Birth: 16-Dec-1976

## 2020-03-27 ENCOUNTER — Ambulatory Visit: Payer: BC Managed Care – PPO

## 2020-03-27 ENCOUNTER — Other Ambulatory Visit: Payer: Self-pay

## 2020-03-27 DIAGNOSIS — M542 Cervicalgia: Secondary | ICD-10-CM

## 2020-03-27 DIAGNOSIS — M545 Low back pain, unspecified: Secondary | ICD-10-CM

## 2020-03-27 DIAGNOSIS — G8929 Other chronic pain: Secondary | ICD-10-CM

## 2020-03-27 DIAGNOSIS — R2681 Unsteadiness on feet: Secondary | ICD-10-CM

## 2020-03-27 DIAGNOSIS — M62838 Other muscle spasm: Secondary | ICD-10-CM

## 2020-03-27 NOTE — Therapy (Signed)
[facility] [address]  [address] [place], [place], [address] Phone: [phone]   Fax:  [phone]  Physical Therapy Treatment  Patient Details  Name: Cindy Hunt MRN: [id number] Date of Birth: 27-Jan-1976 Referring Provider (PT): Peggye Ley, MD   Encounter Date: 03/27/2020  PT End of Session - 03/27/20 1328    Visit Number  9    Number of Visits  13    Date for PT Re-Evaluation  04/10/20    Authorization Type  [employer]    PT Start Time  1319    PT Stop Time  1410    PT Time Calculation (min)  51 min    Activity Tolerance  Patient tolerated treatment well    Behavior During Therapy  [facility] for tasks assessed/performed       No past medical history on file.  No past surgical history on file.  There were no vitals filed for this visit.  Subjective Assessment - 03/27/20 1323    Subjective  Pt. reporting she has had two episodes dizziness which lasted 30 seconds after rotating her head to the right on Saturday and Sunday.    Diagnostic tests  none recent    Patient Stated Goals  "work on stretching"    Currently in Pain?  Yes    Pain Score  9     Pain Location  Neck    Pain Orientation  Right    Pain Descriptors / Indicators  Tightness    Pain Type  Acute pain;Surgical pain                       [facility] Adult PT Treatment/Exercise - 03/27/20 0001      Neck Exercises: Machines for Strengthening   UBE (Upper Arm Bike)  Lvl 2.5, 3 min forwards, 3 min backwards       Moist Heat Therapy   Number Minutes Moist Heat  10 Minutes    Moist Heat Location  Cervical      Electrical Stimulation   Electrical Stimulation Location  cervical spine     Electrical Stimulation Action  IFC    Electrical Stimulation Parameters  80-150Hz , intensity to pt. tolerance, 10'    Electrical Stimulation Goals  Pain      Manual Therapy   Manual Therapy  Soft tissue mobilization;Myofascial release;Passive  ROM;Manual Traction    Manual therapy comments  supine     Soft tissue mobilization  STM to cervical paraspinals, R UT, LS, R scalenes     Myofascial Release  TPR to R cervical paraspinals, suboccipital release     Passive ROM  Manual R scalenes, R UT, R LS stretch x 30 sec     Manual Traction  Gentle manual cervical traction 3 x 30 sec       Neck Exercises: Stretches   Upper Trapezius Stretch  Right;2 reps    Upper Trapezius Stretch Limitations  hands anchored on table     Levator Stretch  Right;2 reps;30 seconds    Levator Stretch Limitations  hands anchored on table     Neck Stretch  2 reps;30 seconds    Neck Stretch Limitations  R scalenes stretch 2 x 30 sec                PT Short Term Goals - 03/09/20 1325      PT SHORT TERM GOAL #1   Title  Patient to be independent with initial  HEP.    Time  3    Period  Weeks    Status  Achieved    Target Date  03/20/20        PT Long Term Goals - 03/23/20 1353      PT LONG TERM GOAL #1   Title  Patient to be independent with advanced HEP.    Time  6    Period  Weeks    Status  On-going      PT LONG TERM GOAL #2   Title  Patient to demonstrate cervical and lumbar AROM WFL with mild pain remaining.    Time  6    Period  Weeks    Status  On-going      PT LONG TERM GOAL #3   Title  Patient to demonstrate B mild tightness remaining in B HS, piriformis, and hip flexors/quads.    Time  6    Period  Weeks    Status  On-going      PT LONG TERM GOAL #4   Title  Patient to report tolerance for 1.5 hours of reading or working on computer without increase in neck pain in order to improve work tolerance.    Time  6    Period  Weeks    Status  On-going   03/23/20 - 40 min before increased neck pain     PT LONG TERM GOAL #5   Title  Patient to score >22/30 on FGA in order to decrease risk of falls.    Time  6    Period  Weeks    Status  Achieved            Plan - 03/27/20 1352    Clinical Impression Statement    She has had two episodes of dizziness which lasted 30 seconds after rotating her head to the right on Saturday and Sunday in bed "turning over".  Has happened a few times today while sitting up and turning head to R.  Unable to reproduce this dizziness in session today to full intensity of reported incidents at home.  Did continue with MT focused on reducing tension/tenderness in cervical musculature with palpable TP noted in R cervical paraspinals.  Ended session with trial of E-stim/moist heat to cervical musculature to reduce tone and pain.  Pt. leaving session noting relief and instructed to inform therapist at upcoming session if ongoing episodes of dizziness/lightheadedness.     Rehab Potential  Good    PT Treatment/Interventions  ADLs/Self Care Home Management;Cryotherapy;Electrical Stimulation;Moist Heat;Balance training;Therapeutic exercise;Therapeutic activities;Functional mobility training;Stair training;Gait training;Ultrasound;Neuromuscular re-education;Patient/family education;Manual techniques;Taping;Energy conservation;Dry needling;Passive range of motion;Scar mobilization    PT Next Visit Plan  Monitor progress from HEP and continue to progress cervical/core stabilization and strength, as well as thoracic mobility and spinal alignment.    Consulted and Agree with Plan of Care  Patient       Patient will benefit from skilled therapeutic intervention in order to improve the following deficits and impairments:  Decreased activity tolerance, Decreased strength, Increased fascial restricitons, Impaired UE functional use, Pain, Decreased balance, Increased muscle spasms, Decreased range of motion, Postural dysfunction, Impaired flexibility  Visit Diagnosis: Cervicalgia  Chronic bilateral low back pain without sciatica  Other muscle spasm  Unsteadiness on feet     Problem List There are no problems to display for this patient.   Bess Harvest, PTA 03/27/20 6:22 PM   [facility] [address]  [address] [place], [place], [address] Phone: [phone]   Fax:  [phone]  Name: Cindy Hunt MRN: [id number] Date of Birth: August 05, 1976

## 2020-03-30 ENCOUNTER — Other Ambulatory Visit: Payer: Self-pay

## 2020-03-30 ENCOUNTER — Ambulatory Visit: Payer: BC Managed Care – PPO | Attending: Neurosurgery | Admitting: Physical Therapy

## 2020-03-30 ENCOUNTER — Encounter: Payer: Self-pay | Admitting: Physical Therapy

## 2020-03-30 DIAGNOSIS — G8929 Other chronic pain: Secondary | ICD-10-CM | POA: Insufficient documentation

## 2020-03-30 DIAGNOSIS — M542 Cervicalgia: Secondary | ICD-10-CM | POA: Diagnosis not present

## 2020-03-30 DIAGNOSIS — M62838 Other muscle spasm: Secondary | ICD-10-CM | POA: Insufficient documentation

## 2020-03-30 DIAGNOSIS — R42 Dizziness and giddiness: Secondary | ICD-10-CM | POA: Insufficient documentation

## 2020-03-30 DIAGNOSIS — R2681 Unsteadiness on feet: Secondary | ICD-10-CM | POA: Diagnosis present

## 2020-03-30 DIAGNOSIS — M545 Low back pain, unspecified: Secondary | ICD-10-CM

## 2020-03-30 NOTE — Therapy (Signed)
[facility] [address]  [place], [place], [address] Phone: [phone]   Fax:  [phone]  Physical Therapy Progress Note  Patient Details  Name: Cindy Hunt MRN: [id number] Date of Birth: 04-22-1976 Referring Provider (PT): Frederich Cha, MD   Encounter Date: 03/30/2020  PT End of Session - 03/30/20 1356    Visit Number  10    Number of Visits  18    Date for PT Re-Evaluation  04/27/20    Authorization Type  [employer]    PT Start Time  4580    PT Stop Time  1350    PT Time Calculation (min)  33 min    Activity Tolerance  Patient tolerated treatment well    Behavior During Therapy  [facility] for tasks assessed/performed       History reviewed. No pertinent past medical history.  History reviewed. No pertinent surgical history.  There were no vitals filed for this visit.  Subjective Assessment - 03/30/20 1318    Subjective  Feels like she is having a "spinning vertigo" issue but it is not consistent. Episodes last 30 sec and have occurred when laying in supine and turning head or getting up out of bed. Worse when turning to R. Noticed this sensation last weekend. Denies trauma, infection, tinnitus or changes in hearing.    Diagnostic tests  none recent    Patient Stated Goals  "work on stretching"    Currently in Pain?  Yes    Pain Score  7     Pain Location  Neck    Pain Orientation  Posterior    Pain Descriptors / Indicators  Tightness    Pain Type  Acute pain;Surgical pain         [facility] PT Assessment - 03/30/20 0001      Assessment   Medical Diagnosis  Cervicalgia, chronic LBP    Referring Provider (PT)  Frederich Cha, MD    Onset Date/Surgical Date  02/02/20      AROM   Cervical Flexion  42   tightness   Cervical Extension  48   tightness   Cervical - Right Side Bend  36   tightness; c/o mildly painful pop   Cervical - Left Side Bend  35   tightness   Cervical - Right Rotation  65    c/o HA   Cervical - Left Rotation  61    Lumbar Flexion  distal shin   tightness in HS   Lumbar Extension  mildly limited    Lumbar - Right Side Bend  distal thigh   slight dizziness   Lumbar - Left Side Bend  distal thigh   slight dizziness   Lumbar - Right Rotation  WNL    Lumbar - Left Rotation  WNL         Vestibular Assessment - 03/30/20 0001      Positional Testing   Dix-Hallpike  Dix-Hallpike Right;Dix-Hallpike Left    Horizontal Canal Testing  Horizontal Canal Right;Horizontal Canal Left      Dix-Hallpike Right   Dix-Hallpike Right Duration  20    Dix-Hallpike Right Symptoms  Upbeat, right rotatory nystagmus      Dix-Hallpike Left   Dix-Hallpike Left Symptoms  No nystagmus   c/o dizziness with ~20 sec latency      Horizontal Canal Right   Horizontal Canal Right Symptoms  Normal      Horizontal Canal Left   Horizontal Canal Left  Symptoms  Normal               [facility] Adult PT Treatment/Exercise - 03/30/20 0001      Neck Exercises: Machines for Strengthening   UBE (Upper Arm Bike)  Lvl 2.5, 3 min forwards, 3 min backwards       Vestibular Treatment/Exercise - 03/30/20 0001      Vestibular Treatment/Exercise   Vestibular Treatment Provided  Canalith Repositioning    Canalith Repositioning  Epley Manuever Right       EPLEY MANUEVER RIGHT   Number of Reps   1    Overall Response  No change    Response Details   report of feeling slightly off balance             PT Education - 03/30/20 1356    Education Details  edu on post-epley instructions; discussion on objective progress and remaining impairments    Person(s) Educated  Patient    Methods  Explanation;Demonstration;Tactile cues;Handout;Verbal cues    Comprehension  Verbalized understanding;Returned demonstration       PT Short Term Goals - 03/09/20 1325      PT SHORT TERM GOAL #1   Title  Patient to be independent with initial HEP.    Time  3    Period  Weeks    Status  Achieved     Target Date  03/20/20        PT Long Term Goals - 03/30/20 1357      PT LONG TERM GOAL #1   Title  Patient to be independent with advanced HEP.    Time  4    Period  Weeks    Status  Partially Met   met for current   Target Date  04/27/20      PT LONG TERM GOAL #2   Title  Patient to demonstrate cervical and lumbar AROM WFL with mild pain remaining.    Time  4    Period  Weeks    Status  Partially Met   cervical AROM improved in flexion, extension, R/L SBing; lumbar AROM unchanged   Target Date  04/27/20      PT LONG TERM GOAL #3   Title  Patient to demonstrate B mild tightness remaining in B HS, piriformis, and hip flexors/quads.    Time  4    Period  Weeks    Status  On-going   HS tightness limiting lumbar flexion AROM today   Target Date  04/27/20      PT LONG TERM GOAL #4   Title  Patient to report tolerance for 1.5 hours of reading or working on computer without increase in neck pain in order to improve work tolerance.    Time  4    Period  Weeks    Status  On-going   03/23/20 - 40 min before increased neck pain   Target Date  04/27/20      PT LONG TERM GOAL #5   Title  Patient to score >22/30 on FGA in order to decrease risk of falls.    Time  4    Period  Weeks    Status  Achieved    Target Date  04/27/20      Additional Long Term Goals   Additional Long Term Goals  Yes      PT LONG TERM GOAL #6   Title  Patient to report full resolution of dizziness with bed mobility.    Time  4  Period  Weeks    Status  New    Target Date  04/27/20            Plan - 03/30/20 1557    Clinical Impression Statement  Patient reporting that she has been having a feeling of spinning since last weekend. Episodes last 30 seconds and occur when turning head to R when in supine or when getting out of bed. Denies trauma, infection, tinnitus, or changes in hearing. Patient has demonstrated improvement in cervical flexion, extension, and R/L sidebending. Lumbar AROM  unchanged. Assessed for positional vertigo d/t patient's c/o dizziness upon laying supine today. Patient with c/o dizziness after ~20 sec latency period and with upbeating R torsional nystagmus with R Dix Hallpike. Treated with R Epley maneuver, which was well-tolerated. Patient reported feeling slightly off balance after maneuver. Educated patient on post-epley instructions. Patient reported understanding and was able to safely ambulate upon leaving. Patient is demonstrating improvement in cervical AROM. Progress likely limited by recent episode of R posterior canal BPPV. Would benefit from skilled PT services 2x/week for 4 weeks to address remaining goals.    Rehab Potential  Good    PT Treatment/Interventions  ADLs/Self Care Home Management;Cryotherapy;Electrical Stimulation;Moist Heat;Balance training;Therapeutic exercise;Therapeutic activities;Functional mobility training;Stair training;Gait training;Ultrasound;Neuromuscular re-education;Patient/family education;Manual techniques;Taping;Energy conservation;Dry needling;Passive range of motion;Scar mobilization    PT Next Visit Plan  Monitor progress from HEP and continue to progress cervical/core stabilization and strength, as well as thoracic mobility and spinal alignment.    Consulted and Agree with Plan of Care  Patient       Patient will benefit from skilled therapeutic intervention in order to improve the following deficits and impairments:  Decreased activity tolerance, Decreased strength, Increased fascial restricitons, Impaired UE functional use, Pain, Decreased balance, Increased muscle spasms, Decreased range of motion, Postural dysfunction, Impaired flexibility  Visit Diagnosis: Cervicalgia  Chronic bilateral low back pain without sciatica  Other muscle spasm  Unsteadiness on feet  Dizziness and giddiness     Problem List There are no problems to display for this patient.    Janene Harvey, PT, DPT 03/30/20 4:01  PM   [facility] [address]  [place], [place], [address] Phone: [phone]   Fax:  [phone]  Name: Cindy Hunt MRN: [id number] Date of Birth: 03-19-1976

## 2020-04-03 ENCOUNTER — Other Ambulatory Visit: Payer: Self-pay

## 2020-04-03 ENCOUNTER — Encounter: Payer: Self-pay | Admitting: Physical Therapy

## 2020-04-03 ENCOUNTER — Ambulatory Visit: Payer: BC Managed Care – PPO | Admitting: Physical Therapy

## 2020-04-03 DIAGNOSIS — M62838 Other muscle spasm: Secondary | ICD-10-CM

## 2020-04-03 DIAGNOSIS — M545 Low back pain, unspecified: Secondary | ICD-10-CM

## 2020-04-03 DIAGNOSIS — R2681 Unsteadiness on feet: Secondary | ICD-10-CM

## 2020-04-03 DIAGNOSIS — M542 Cervicalgia: Secondary | ICD-10-CM

## 2020-04-03 DIAGNOSIS — G8929 Other chronic pain: Secondary | ICD-10-CM

## 2020-04-03 DIAGNOSIS — R42 Dizziness and giddiness: Secondary | ICD-10-CM

## 2020-04-03 NOTE — Therapy (Signed)
[facility] [address]  [place], [place], [address] Phone: [phone]   Fax:  [phone]  Physical Therapy Treatment  Patient Details  Name: Cindy Hunt MRN: [id number] Date of Birth: 07/08/1976 Referring Provider (PT): Frederich Cha, MD   Encounter Date: 04/03/2020  PT End of Session - 04/03/20 1407    Visit Number  11    Number of Visits  18    Date for PT Re-Evaluation  04/27/20    Authorization Type  [employer]    PT Start Time  1316    PT Stop Time  1400    PT Time Calculation (min)  44 min    Activity Tolerance  Patient tolerated treatment well    Behavior During Therapy  [facility] for tasks assessed/performed       History reviewed. No pertinent past medical history.  History reviewed. No pertinent surgical history.  There were no vitals filed for this visit.  Subjective Assessment - 04/03/20 1317    Subjective  Patient reports that she feels less dizzy since last session but still noting dizziness with R head turns. Feels like it may be related to her jaw. No dizziness currently, but feeling "floaty headed." Had some LBP from potting plants over the weekend.    Diagnostic tests  none recent    Patient Stated Goals  "work on stretching"    Currently in Pain?  No/denies             Vestibular Assessment - 04/03/20 0001      Vestibulo-Ocular Reflex   VOR 1 Head Only (x 1 viewing)  horizontal & vertical    c/o mild dizziness with horizontal motion     Positional Testing   Dix-Hallpike  Dix-Hallpike Right      Dix-Hallpike Right   Dix-Hallpike Right Symptoms  No nystagmus   no dizziness     Dix-Hallpike Left   Dix-Hallpike Left Symptoms  No nystagmus      Horizontal Canal Right   Horizontal Canal Right Symptoms  Normal      Horizontal Canal Left   Horizontal Canal Left Symptoms  Normal               [facility] Adult PT Treatment/Exercise - 04/03/20 0001      Neck  Exercises: Machines for Strengthening   UBE (Upper Arm Bike)  Lvl 2.5, 3 min forwards, 3 min backwards       Lumbar Exercises: Quadruped   Madcat/Old Horse  10 reps    Madcat/Old Horse Limitations  cat/cow in quaruped    very limited lumbar lordosis   Other Quadruped Lumbar Exercises  child's pose 5x5" forward   c/o stiffness      Knee/Hip Exercises: Standing   Other Standing Knee Exercises  standing thoracic extension against wall 5x3"; lumbar extension 5x3"      Vestibular Treatment/Exercise - 04/03/20 0001      Vestibular Treatment/Exercise   Vestibular Treatment Provided  Habituation    Habituation Exercises  Legrand Como Daroff   Number of Reps   3    Symptom Description   to R; 3rd rep with [employer] [facility] [employer] [facility]            PT Education - 04/03/20 1407    Education Details  update to HEP    Person(s) Educated  Patient    Methods  Explanation;Demonstration;Tactile cues;Verbal cues;Handout    Comprehension  Verbalized understanding;Returned demonstration  PT Short Term Goals - 03/09/20 1325      PT SHORT TERM GOAL #1   Title  Patient to be independent with initial HEP.    Time  3    Period  Weeks    Status  Achieved    Target Date  03/20/20        PT Long Term Goals - 03/30/20 1357      PT LONG TERM GOAL #1   Title  Patient to be independent with advanced HEP.    Time  4    Period  Weeks    Status  Partially Met   met for current   Target Date  04/27/20      PT LONG TERM GOAL #2   Title  Patient to demonstrate cervical and lumbar AROM WFL with mild pain remaining.    Time  4    Period  Weeks    Status  Partially Met   cervical AROM improved in flexion, extension, R/L SBing; lumbar AROM unchanged   Target Date  04/27/20      PT LONG TERM GOAL #3   Title  Patient to demonstrate B mild tightness remaining in B HS, piriformis, and hip flexors/quads.    Time  4    Period  Weeks    Status  On-going   HS tightness limiting lumbar flexion AROM today    Target Date  04/27/20      PT LONG TERM GOAL #4   Title  Patient to report tolerance for 1.5 hours of reading or working on computer without increase in neck pain in order to improve work tolerance.    Time  4    Period  Weeks    Status  On-going   03/23/20 - 40 min before increased neck pain   Target Date  04/27/20      PT LONG TERM GOAL #5   Title  Patient to score >22/30 on FGA in order to decrease risk of falls.    Time  4    Period  Weeks    Status  Achieved    Target Date  04/27/20      Additional Long Term Goals   Additional Long Term Goals  Yes      PT LONG TERM GOAL #6   Title  Patient to report full resolution of dizziness with bed mobility.    Time  4    Period  Weeks    Status  New    Target Date  04/27/20            Plan - 04/03/20 1407    Clinical Impression Statement  Patient reporting improvement in dizziness since last session but still with some remaining dizziness with R head turns. Patient feels as though her jaw malocclusion may be contributing to this dizziness. Focused beginning of session on lumbopelvic ROM d/t patient's report of increased LBP with gardening over the weekend. Patient demonstrated considerable limitation in lumbar lordosis with cat/cow and with report of "achiness." Continued to work on thoracic and lumbar extension ROM to patient's tolerance and updated HEP with exercises that were well-tolerated today. Patient reported understanding. Re-assessed R and L [employer] as well as R/L roll test, all of which were negative for dizziness and nystagmus. Assessed VOR- patient mildly symptomatic with horizontal VOR. Nestor Lewandowsky to R was asymptomatic. Plan to continue assessing patient' motion sensitivity to further address dizziness.    Rehab Potential  Good    PT  Treatment/Interventions  ADLs/Self Care Home Management;Cryotherapy;Electrical Stimulation;Moist Heat;Balance training;Therapeutic exercise;Therapeutic activities;Functional  mobility training;Stair training;Gait training;Ultrasound;Neuromuscular re-education;Patient/family education;Manual techniques;Taping;Energy conservation;Dry needling;Passive range of motion;Scar mobilization    PT Next Visit Plan  Monitor progress from HEP and continue to progress cervical/core stabilization and strength, as well as thoracic mobility and spinal alignment.    Consulted and Agree with Plan of Care  Patient       Patient will benefit from skilled therapeutic intervention in order to improve the following deficits and impairments:  Decreased activity tolerance, Decreased strength, Increased fascial restricitons, Impaired UE functional use, Pain, Decreased balance, Increased muscle spasms, Decreased range of motion, Postural dysfunction, Impaired flexibility  Visit Diagnosis: Cervicalgia  Chronic bilateral low back pain without sciatica  Other muscle spasm  Unsteadiness on feet  Dizziness and giddiness     Problem List There are no problems to display for this patient.   Janene Harvey, PT, DPT 04/03/20 2:09 PM   [facility] [address]  [place], [place], [address] Phone: [phone]   Fax:  [phone]  Name: Cindy Hunt MRN: [id number] Date of Birth: 1976/08/21

## 2020-04-06 ENCOUNTER — Other Ambulatory Visit: Payer: Self-pay

## 2020-04-06 ENCOUNTER — Ambulatory Visit: Payer: BC Managed Care – PPO | Admitting: Physical Therapy

## 2020-04-06 ENCOUNTER — Encounter: Payer: Self-pay | Admitting: Physical Therapy

## 2020-04-06 DIAGNOSIS — R2681 Unsteadiness on feet: Secondary | ICD-10-CM

## 2020-04-06 DIAGNOSIS — M545 Low back pain, unspecified: Secondary | ICD-10-CM

## 2020-04-06 DIAGNOSIS — R42 Dizziness and giddiness: Secondary | ICD-10-CM

## 2020-04-06 DIAGNOSIS — M542 Cervicalgia: Secondary | ICD-10-CM

## 2020-04-06 DIAGNOSIS — G8929 Other chronic pain: Secondary | ICD-10-CM

## 2020-04-06 DIAGNOSIS — M62838 Other muscle spasm: Secondary | ICD-10-CM

## 2020-04-06 NOTE — Therapy (Signed)
[facility] [address]  [place], [place], [address] Phone: [phone]   Fax:  [phone]  Physical Therapy Treatment  Patient Details  Name: Cindy Hunt MRN: [id number] Date of Birth: Apr 21, 1976 Referring Provider (PT): Frederich Cha, MD   Encounter Date: 04/06/2020  PT End of Session - 04/06/20 1400    Visit Number  12    Number of Visits  18    Date for PT Re-Evaluation  04/27/20    Authorization Type  [employer]    PT Start Time  1318    PT Stop Time  1359    PT Time Calculation (min)  41 min    Activity Tolerance  Patient tolerated treatment well    Behavior During Therapy  [facility] for tasks assessed/performed       History reviewed. No pertinent past medical history.  History reviewed. No pertinent surgical history.  There were no vitals filed for this visit.  Subjective Assessment - 04/06/20 1319    Subjective  Still reporting "floating dizziness" is still occuring but no longer having spinning sensation. Notices that certain head/neck positions will ellicit it. Also having pain on the R side of her jaw.    Diagnostic tests  none recent    Patient Stated Goals  "work on stretching"    Currently in Pain?  Yes    Pain Score  7     Pain Location  Neck    Pain Orientation  Posterior;Left;Right    Pain Descriptors / Indicators  Tightness    Pain Type  Acute pain;Surgical pain                       [facility] Adult PT Treatment/Exercise - 04/06/20 0001      Neck Exercises: Machines for Strengthening   UBE (Upper Arm Bike)  Lvl 2.5, 3 min forwards, 3 min backwards       Lumbar Exercises: Stretches   Other Lumbar Stretch Exercise  standing "windmill" against wall x5 each side   report of pec stretch     Shoulder Exercises: Standing   Flexion  Strengthening;Both;10 reps    Shoulder Flexion Weight (lbs)  Y lift offs from wall   cues for scap depression and retraction   Other  Standing Exercises  R/L resisted trunk rotation with green TB x10 each side   cues for increased eccentric contorl   Other Standing Exercises  R/L paloff press with green TB x10 each side   good form     Shoulder Exercises: Stretch   Corner Stretch  1 rep;30 seconds    Corner Stretch Limitations  90/90 in corner      Manual Therapy   Manual Therapy  Soft tissue mobilization;Myofascial release    Manual therapy comments  sitting    Soft tissue mobilization  STM/IASTM to B cervical paraspinals, scalenes, suboccipitals, UT, LS- soft tissue restriction most evident R vs L    Myofascial Release  TPR to B UT, R cervical paraspinals and suboccipitals, R scalenes             PT Education - 04/06/20 1400    Education Details  update to HEP    Person(s) Educated  Patient    Methods  Explanation;Demonstration;Tactile cues;Verbal cues;Handout    Comprehension  Verbalized understanding;Returned demonstration       PT Short Term Goals - 03/09/20 1325      PT SHORT TERM GOAL #1  Title  Patient to be independent with initial HEP.    Time  3    Period  Weeks    Status  Achieved    Target Date  03/20/20        PT Long Term Goals - 04/06/20 1404      PT LONG TERM GOAL #1   Title  Patient to be independent with advanced HEP.    Time  4    Period  Weeks    Status  Partially Met   met for current     PT LONG TERM GOAL #2   Title  Patient to demonstrate cervical and lumbar AROM WFL with mild pain remaining.    Time  4    Period  Weeks    Status  Partially Met   cervical AROM improved in flexion, extension, R/L SBing; lumbar AROM unchanged     PT LONG TERM GOAL #3   Title  Patient to demonstrate B mild tightness remaining in B HS, piriformis, and hip flexors/quads.    Time  4    Period  Weeks    Status  On-going   HS tightness limiting lumbar flexion AROM today     PT LONG TERM GOAL #4   Title  Patient to report tolerance for 1.5 hours of reading or working on computer  without increase in neck pain in order to improve work tolerance.    Time  4    Period  Weeks    Status  On-going   03/23/20 - 40 min before increased neck pain     PT LONG TERM GOAL #5   Title  Patient to score >22/30 on FGA in order to decrease risk of falls.    Time  4    Period  Weeks    Status  Achieved      PT LONG TERM GOAL #6   Title  Patient to report full resolution of dizziness with bed mobility.    Time  4    Period  Weeks    Status  On-going            Plan - 04/06/20 1400    Clinical Impression Statement  Patient reporting continued tightness in B suboccipital muscles and dizziness with varying head positions. Still reporting that she believes her jaw malocclusion is contributing to the dizziness and from her subjective report, remaining dizziness sounds more cervicogenic in nature. Addressed patient's continued c/o cervical tightness with STM, IASTM, and TPR. Patient demonstrating increased soft tissue restriction R vs L, particularly in cervical paraspinals and suboccipitals. Worked on engaging core musculature with banded resistance with patient reporting challenge but no pain and demonstrating good effort to maintain muscle control throughout. Challenged thoracic mobility with patient reporting intense stretch through pecs. Followed with pec stretch at end of session which patient tolerated well. Updated this exercise into HEP- patient reported understanding and without complaints at end of session.    Rehab Potential  Good    PT Treatment/Interventions  ADLs/Self Care Home Management;Cryotherapy;Electrical Stimulation;Moist Heat;Balance training;Therapeutic exercise;Therapeutic activities;Functional mobility training;Stair training;Gait training;Ultrasound;Neuromuscular re-education;Patient/family education;Manual techniques;Taping;Energy conservation;Dry needling;Passive range of motion;Scar mobilization    PT Next Visit Plan  Monitor progress from HEP and continue to  progress cervical/core stabilization and strength, as well as thoracic mobility and spinal alignment.    Consulted and Agree with Plan of Care  Patient       Patient will benefit from skilled therapeutic intervention in order to improve the following deficits  and impairments:  Decreased activity tolerance, Decreased strength, Increased fascial restricitons, Impaired UE functional use, Pain, Decreased balance, Increased muscle spasms, Decreased range of motion, Postural dysfunction, Impaired flexibility  Visit Diagnosis: Cervicalgia  Chronic bilateral low back pain without sciatica  Other muscle spasm  Unsteadiness on feet  Dizziness and giddiness     Problem List There are no problems to display for this patient.    Janene Harvey, PT, DPT 04/06/20 2:04 PM   [facility] [address]  [place], [place], [address] Phone: [phone]   Fax:  [phone]  Name: Cindy Hunt MRN: [id number] Date of Birth: 05/06/1976

## 2020-04-10 ENCOUNTER — Other Ambulatory Visit: Payer: Self-pay

## 2020-04-10 ENCOUNTER — Ambulatory Visit: Payer: BC Managed Care – PPO

## 2020-04-10 DIAGNOSIS — M542 Cervicalgia: Secondary | ICD-10-CM | POA: Diagnosis not present

## 2020-04-10 DIAGNOSIS — M62838 Other muscle spasm: Secondary | ICD-10-CM

## 2020-04-10 DIAGNOSIS — R42 Dizziness and giddiness: Secondary | ICD-10-CM

## 2020-04-10 DIAGNOSIS — R2681 Unsteadiness on feet: Secondary | ICD-10-CM

## 2020-04-10 DIAGNOSIS — G8929 Other chronic pain: Secondary | ICD-10-CM

## 2020-04-10 DIAGNOSIS — M545 Low back pain, unspecified: Secondary | ICD-10-CM

## 2020-04-10 NOTE — Therapy (Signed)
[facility] [address]  [place], [place], [address] Phone: [phone]   Fax:  [phone]  Physical Therapy Treatment  Patient Details  Name: Cindy Hunt MRN: [id number] Date of Birth: 1976-07-26 Referring Provider (PT): Frederich Cha, MD   Encounter Date: 04/10/2020  PT End of Session - 04/10/20 1325    Visit Number  13    Number of Visits  18    Date for PT Re-Evaluation  04/27/20    Authorization Type  [employer]    PT Start Time  1320    PT Stop Time  1358    PT Time Calculation (min)  38 min    Activity Tolerance  Patient tolerated treatment well    Behavior During Therapy  [facility] for tasks assessed/performed       No past medical history on file.  No past surgical history on file.  There were no vitals filed for this visit.  Subjective Assessment - 04/10/20 1324    Subjective  Pt. reporting R jaw "soreness" which is constant and she feels is coming from her bite pattern.  Seeing a TMJ specialist tomorrow.    Diagnostic tests  none recent    Patient Stated Goals  "work on stretching"    Currently in Pain?  Yes    Pain Location  Neck    Pain Orientation  Posterior;Right;Left    Pain Descriptors / Indicators  Tightness    Pain Type  Acute pain;Surgical pain         [facility] PT Assessment - 04/10/20 0001      Assessment   Medical Diagnosis  Cervicalgia, chronic LBP    Referring Provider (PT)  Frederich Cha, MD    Onset Date/Surgical Date  02/02/20    Next MD Visit  04/21/20                   [facility] Adult PT Treatment/Exercise - 04/10/20 0001      Neck Exercises: Machines for Strengthening   UBE (Upper Arm Bike)  Lvl 2.5, 3 min forwards, 3 min backwards       Manual Therapy   Manual Therapy  Soft tissue mobilization;Myofascial release;Passive ROM;Muscle Energy Technique    Manual therapy comments  sitting    Soft tissue mobilization  STM/DMT to B UT, LS, cervical  paraspinals,     Myofascial Release  TPR to B UT, L cervical paraspinals R scalenes    Passive ROM  Manual cervical PROM stretching into LS, UT, B Scalenes    Muscle Energy Technique  B contract/relax strethc for B hamstrings with therapist x 3 rounds each LE                PT Short Term Goals - 03/09/20 1325      PT SHORT TERM GOAL #1   Title  Patient to be independent with initial HEP.    Time  3    Period  Weeks    Status  Achieved    Target Date  03/20/20        PT Long Term Goals - 04/06/20 1404      PT LONG TERM GOAL #1   Title  Patient to be independent with advanced HEP.    Time  4    Period  Weeks    Status  Partially Met   met for current     PT LONG TERM GOAL #2   Title  Patient to  demonstrate cervical and lumbar AROM WFL with mild pain remaining.    Time  4    Period  Weeks    Status  Partially Met   cervical AROM improved in flexion, extension, R/L SBing; lumbar AROM unchanged     PT LONG TERM GOAL #3   Title  Patient to demonstrate B mild tightness remaining in B HS, piriformis, and hip flexors/quads.    Time  4    Period  Weeks    Status  On-going   HS tightness limiting lumbar flexion AROM today     PT LONG TERM GOAL #4   Title  Patient to report tolerance for 1.5 hours of reading or working on computer without increase in neck pain in order to improve work tolerance.    Time  4    Period  Weeks    Status  On-going   03/23/20 - 40 min before increased neck pain     PT LONG TERM GOAL #5   Title  Patient to score >22/30 on FGA in order to decrease risk of falls.    Time  4    Period  Weeks    Status  Achieved      PT LONG TERM GOAL #6   Title  Patient to report full resolution of dizziness with bed mobility.    Time  4    Period  Weeks    Status  On-going            Plan - 04/10/20 1327    Clinical Impression Statement   Pt. seeing a TMJ specialist tomorrow to address jaw "soreness" which she feels is coming from uneven bite  pattern.  MT addressing palpable TP in L cervical paraspinals and UT.  DTM addressed increased tone/tension throughout upper shoulder musculature and cervical spine.  Pt. admitting to long/stressful hours at work and admits to losing mindfulness of her posture at times while sitting working.  Encouraged pt. to change positions frequently and perform HEP cervical stretching, postural strengthening throughout her workday at home.  Pt. verbalizing understanding.     Rehab Potential  Good    PT Treatment/Interventions  ADLs/Self Care Home Management;Cryotherapy;Electrical Stimulation;Moist Heat;Balance training;Therapeutic exercise;Therapeutic activities;Functional mobility training;Stair training;Gait training;Ultrasound;Neuromuscular re-education;Patient/family education;Manual techniques;Taping;Energy conservation;Dry needling;Passive range of motion;Scar mobilization    PT Next Visit Plan  Monitor progress from HEP and continue to progress cervical/core stabilization and strength, as well as thoracic mobility and spinal alignment.    Consulted and Agree with Plan of Care  Patient       Patient will benefit from skilled therapeutic intervention in order to improve the following deficits and impairments:  Decreased activity tolerance, Decreased strength, Increased fascial restricitons, Impaired UE functional use, Pain, Decreased balance, Increased muscle spasms, Decreased range of motion, Postural dysfunction, Impaired flexibility  Visit Diagnosis: Cervicalgia  Chronic bilateral low back pain without sciatica  Other muscle spasm  Unsteadiness on feet  Dizziness and giddiness     Problem List There are no problems to display for this patient.   Bess Harvest, PTA 04/10/20 [facility] [address]  [place], [place], [address] Phone: [phone]   Fax:  [phone]  Name: Cindy Hunt MRN: [id number] Date of  Birth: 03-Aug-1976

## 2020-04-13 ENCOUNTER — Ambulatory Visit: Payer: BC Managed Care – PPO | Admitting: Physical Therapy

## 2020-04-13 ENCOUNTER — Other Ambulatory Visit: Payer: Self-pay

## 2020-04-13 ENCOUNTER — Encounter: Payer: Self-pay | Admitting: Physical Therapy

## 2020-04-13 DIAGNOSIS — R42 Dizziness and giddiness: Secondary | ICD-10-CM

## 2020-04-13 DIAGNOSIS — G8929 Other chronic pain: Secondary | ICD-10-CM

## 2020-04-13 DIAGNOSIS — M542 Cervicalgia: Secondary | ICD-10-CM | POA: Diagnosis not present

## 2020-04-13 DIAGNOSIS — M62838 Other muscle spasm: Secondary | ICD-10-CM

## 2020-04-13 DIAGNOSIS — M545 Low back pain, unspecified: Secondary | ICD-10-CM

## 2020-04-13 DIAGNOSIS — R2681 Unsteadiness on feet: Secondary | ICD-10-CM

## 2020-04-13 NOTE — Therapy (Addendum)
[facility] [address]  [place], [place], [address] Phone: [phone]   Fax:  [phone]  Physical Therapy Treatment  Patient Details  Name: Cindy Hunt MRN: [id number] Date of Birth: 1976/02/27 Referring Provider (PT): Frederich Cha, MD   Encounter Date: 04/13/2020  PT End of Session - 04/13/20 1514    Visit Number  14    Number of Visits  18    Date for PT Re-Evaluation  04/27/20    Authorization Type  [employer]    PT Start Time  1315    PT Stop Time  1359    PT Time Calculation (min)  44 min    Activity Tolerance  Patient tolerated treatment well    Behavior During Therapy  [facility] for tasks assessed/performed       History reviewed. No pertinent past medical history.  History reviewed. No pertinent surgical history.  There were no vitals filed for this visit.  Subjective Assessment - 04/13/20 1316    Subjective  Went to a TMJ dentist and got a new bite piece that she feels is making her dizzy. Also noticing some dizziness when laying down.    Diagnostic tests  none recent    Patient Stated Goals  "work on stretching"    Currently in Pain?  Yes    Pain Score  5     Pain Location  Neck    Pain Orientation  Right;Left;Posterior    Pain Descriptors / Indicators  Tightness    Pain Type  Acute pain;Surgical pain         [facility] PT Assessment - 04/13/20 0001      AROM   Cervical Flexion  31   "tightness"   Cervical Extension  45   "tightness"   Cervical - Right Side Bend  39   "tightness"   Cervical - Left Side Bend  45   "tightness"   Cervical - Right Rotation  65   "tightness"   Cervical - Left Rotation  64   "tightness"   Lumbar Flexion  distal shin    Lumbar Extension  mildly limited    Lumbar - Right Side Bend  distal thigh    Lumbar - Left Side Bend  mid thigh    Lumbar - Right Rotation  WNL    Lumbar - Left Rotation  WNL   audible nonpainful pop in L knee     Flexibility    Hamstrings  B severely tight    Quadriceps  B mildly tight in mod thomas    Piriformis  R mildly tight, L moderately in fig 4         Vestibular Assessment - 04/13/20 0001      Positional Testing   Dix-Hallpike  Dix-Hallpike Right      Dix-Hallpike Right   Dix-Hallpike Right Symptoms  No nystagmus   mild dizziness on return to sit     Dix-Hallpike Left   Dix-Hallpike Left Symptoms  No nystagmus   mild dizziness on return to sit     Horizontal Canal Right   Horizontal Canal Right Symptoms  Normal      Horizontal Canal Left   Horizontal Canal Left Symptoms  Normal               [facility] Adult PT Treatment/Exercise - 04/13/20 0001      Neck Exercises: Machines for Strengthening   UBE (Upper Arm Bike)  Lvl 2.5, 3 min forwards,  3 min backwards       Vestibular Treatment/Exercise - 04/13/20 0001      Vestibular Treatment/Exercise   Gaze Exercises  X1 Viewing Horizontal;X1 Viewing Vertical      Nestor Lewandowsky   Number of Reps   3    Symptom Description   2x to each side; 1x each side with EC   increased dizziness with EC to L     X1 Viewing Horizontal   Foot Position  sitting, standing    Reps  10    Comments  dizziness no worse      X1 Viewing Vertical   Foot Position  sitting, standing     Reps  10    Comments  mild dizziness            PT Education - 04/13/20 1513    Education Details  update to HEP; discussion on objective progress and remaining impairments    Person(s) Educated  Patient    Methods  Explanation;Demonstration;Tactile cues;Verbal cues;Handout    Comprehension  Verbalized understanding;Returned demonstration       PT Short Term Goals - 04/13/20 1347      PT SHORT TERM GOAL #1   Title  Patient to be independent with initial HEP.    Time  3    Period  Weeks    Status  Achieved    Target Date  03/20/20        PT Long Term Goals - 04/13/20 1347      PT LONG TERM GOAL #1   Title  Patient to be independent with advanced HEP.     Time  4    Period  Weeks    Status  Partially Met   met for current     PT LONG TERM GOAL #2   Title  Patient to demonstrate cervical and lumbar AROM WFL with mild pain remaining.    Time  4    Period  Weeks    Status  Partially Met   lumbar AROM practically unchanged; cervical AROM improved in B sidebending and L rotation     PT LONG TERM GOAL #3   Title  Patient to demonstrate B mild tightness remaining in B HS, piriformis, and hip flexors/quads.    Time  4    Period  Weeks    Status  Partially Met   B HS still severely tight, L piriformis still moderately tight     PT LONG TERM GOAL #4   Title  Patient to report tolerance for 1.5 hours of reading or working on computer without increase in neck pain in order to improve work tolerance.    Time  4    Period  Weeks    Status  On-going   1 hour before increased neck pain     PT LONG TERM GOAL #5   Title  Patient to score >22/30 on FGA in order to decrease risk of falls.    Time  4    Period  Weeks    Status  Achieved      PT LONG TERM GOAL #6   Title  Patient to report full resolution of dizziness with bed mobility.    Time  4    Period  Weeks    Status  On-going   still reporting dizziness with supine>sit           Plan - 04/13/20 1518    Clinical Impression Statement  Patient reporting increase in dizziness  since getting a new bite piece for TMD from her dentist. Noting return of dizziness when laying down. Patient did report mild dizziness on return to sit from R and L DH, but patient asymptomatic during testing itself. Patient also asymptomatic and without visible nystagmus with R and L roll test. Worked on habituation with Nestor Lewandowsky as well as VOR training as patient was mildly symptomatic with these exercises. Updated these into HEP- patient reported understanding. Patient now noting 1 hour of computer work before experiencing an increase in neck pain. ROM testing today revealed lumbar AROM practically  unchanged, however, cervical AROM improved in B sidebending and L rotation. Patient has also demonstrated improvement in LE flexibility, with B HS and L piriformis tightness still remaining. Patient overall showing slow but stead progress towards goals, with multiple areas of pain and concomittant dizziness as barriers to progress.    Rehab Potential  Good    PT Treatment/Interventions  ADLs/Self Care Home Management;Cryotherapy;Electrical Stimulation;Moist Heat;Balance training;Therapeutic exercise;Therapeutic activities;Functional mobility training;Stair training;Gait training;Ultrasound;Neuromuscular re-education;Patient/family education;Manual techniques;Taping;Energy conservation;Dry needling;Passive range of motion;Scar mobilization    PT Next Visit Plan  Monitor progress from HEP and continue to progress cervical/core stabilization and strength, as well as thoracic mobility and spinal alignment.    Consulted and Agree with Plan of Care  Patient       Patient will benefit from skilled therapeutic intervention in order to improve the following deficits and impairments:  Decreased activity tolerance, Decreased strength, Increased fascial restricitons, Impaired UE functional use, Pain, Decreased balance, Increased muscle spasms, Decreased range of motion, Postural dysfunction, Impaired flexibility  Visit Diagnosis: Cervicalgia  Chronic bilateral low back pain without sciatica  Other muscle spasm  Unsteadiness on feet  Dizziness and giddiness     Problem List There are no problems to display for this patient.   Janene Harvey, PT, DPT 04/13/20 3:24 PM   [facility] [address]  [place], [place], [address] Phone: [phone]   Fax:  [phone]  Name: Larue Drawdy MRN: [id number] Date of Birth: [birth year]

## 2020-04-24 ENCOUNTER — Encounter: Payer: Self-pay | Admitting: Physical Therapy

## 2020-04-24 ENCOUNTER — Ambulatory Visit: Payer: BC Managed Care – PPO | Admitting: Physical Therapy

## 2020-04-24 ENCOUNTER — Other Ambulatory Visit: Payer: Self-pay

## 2020-04-24 DIAGNOSIS — R2681 Unsteadiness on feet: Secondary | ICD-10-CM

## 2020-04-24 DIAGNOSIS — M545 Low back pain, unspecified: Secondary | ICD-10-CM

## 2020-04-24 DIAGNOSIS — M62838 Other muscle spasm: Secondary | ICD-10-CM

## 2020-04-24 DIAGNOSIS — G8929 Other chronic pain: Secondary | ICD-10-CM

## 2020-04-24 DIAGNOSIS — R42 Dizziness and giddiness: Secondary | ICD-10-CM

## 2020-04-24 DIAGNOSIS — M542 Cervicalgia: Secondary | ICD-10-CM | POA: Diagnosis not present

## 2020-04-24 NOTE — Therapy (Signed)
[facility] [address]  [place], [place], [address] Phone: [phone]   Fax:  [phone]  Physical Therapy Discharge Summary  Patient Details  Name: Cindy Hunt MRN: [id number] Date of Birth: [birth year] Referring Provider (PT): Frederich Cha, MD   Encounter Date: 04/24/2020  PT End of Session - 04/24/20 1349    Visit Number  15    Number of Visits  18    Date for PT Re-Evaluation  04/27/20    Authorization Type  [employer]    PT Start Time  8206    PT Stop Time  1347    PT Time Calculation (min)  30 min    Activity Tolerance  Patient tolerated treatment well    Behavior During Therapy  [facility] for tasks assessed/performed       History reviewed. No pertinent past medical history.  History reviewed. No pertinent surgical history.  There were no vitals filed for this visit.  Subjective Assessment - 04/24/20 1318    Subjective  Feeling about the same as far as dizziness. Still getting R sided cramping pain in neck. Would like to wrap up with therapy today. Reporting 50% improvement since initial eval.    Diagnostic tests  none recent    Patient Stated Goals  "work on stretching"    Currently in Pain?  Yes    Pain Score  4     Pain Location  Neck    Pain Orientation  Right;Posterior    Pain Descriptors / Indicators  Tiring;Tender    Pain Type  Acute pain;Surgical pain         [facility] PT Assessment - 04/24/20 0001      AROM   AROM Assessment Site  --   c/o tightness with cervical AROM   Cervical Flexion  53    Cervical Extension  47    Cervical - Right Side Bend  39    Cervical - Left Side Bend  45    Cervical - Right Rotation  60    Cervical - Left Rotation  64    Lumbar Flexion  ankles   w/ knees bent   Lumbar Extension  mildly limited    Lumbar - Right Side Bend  distal thigh    Lumbar - Left Side Bend  distal thigh    Lumbar - Right Rotation  WNL    Lumbar - Left Rotation  WNL       Flexibility   Hamstrings  B severely tight    Quadriceps  B mildly tight in mod thomas    Piriformis  B moderately tight                   [facility] Adult PT Treatment/Exercise - 04/24/20 0001      Neck Exercises: Machines for Strengthening   UBE (Upper Arm Bike)  Lvl 2.5, 3 min forwards, 3 min backwards              PT Education - 04/24/20 1348    Education Details  consolidation of HEP ; edu on on objective progress and remaining impairments    Person(s) Educated  Patient    Methods  Explanation;Demonstration;Tactile cues;Verbal cues;Handout    Comprehension  Verbalized understanding;Returned demonstration       PT Short Term Goals - 04/24/20 1325      PT SHORT TERM GOAL #1   Title  Patient to be independent with initial HEP.  Time  3    Period  Weeks    Status  Achieved    Target Date  03/20/20        PT Long Term Goals - 04/24/20 1325      PT LONG TERM GOAL #1   Title  Patient to be independent with advanced HEP.    Time  4    Period  Weeks    Status  Achieved      PT LONG TERM GOAL #2   Title  Patient to demonstrate cervical and lumbar AROM WFL with mild pain remaining.    Time  4    Period  Weeks    Status  Partially Met   cervical AROM improved in flexion and extension, lumbar AROM improved in flexion and L sidebending     PT LONG TERM GOAL #3   Title  Patient to demonstrate B mild tightness remaining in B HS, piriformis, and hip flexors/quads.    Time  4    Period  Weeks    Status  Partially Met   B HS still severely tight, L piriformis still moderately tight     PT LONG TERM GOAL #4   Title  Patient to report tolerance for 1.5 hours of reading or working on computer without increase in neck pain in order to improve work tolerance.    Time  4    Period  Weeks    Status  On-going   1 hour before increased neck pain     PT LONG TERM GOAL #5   Title  Patient to score >22/30 on FGA in order to decrease risk of falls.    Time  4     Period  Weeks    Status  Achieved      PT LONG TERM GOAL #6   Title  Patient to report full resolution of dizziness with bed mobility.    Time  4    Period  Weeks    Status  Achieved   reporting resolution with bed mobility, still evident with walking           Plan - 04/24/20 1351    Clinical Impression Statement  Patient reporting 50% improvement in neck and back pain since initial eval. However, notes that she is still struggling with the "drunk feeling" which she feels still limits her. MD aware and patient notes that a repeat cervical MRI may be performed on her f/u in August. Patient noting that she would like to wrap up with therapy today. Cervical AROM has improved in flexion and extension. Lumbar AROM improved in flexion and L sidebending. Patient is able to tolerate 1 hour of working on the computer before an increase in neck pain. Reports resolution of dizziness with bed mobility, but notes that this is still evident with walking. B HS still severely tight and B piriformis still moderately tight with flexibility testing. Consolidated HEP with exercises to address remaining impairments. Patient reported understanding and without questions at end of session. Discharging patient at this time per her request.    Rehab Potential  Good    PT Treatment/Interventions  ADLs/Self Care Home Management;Cryotherapy;Electrical Stimulation;Moist Heat;Balance training;Therapeutic exercise;Therapeutic activities;Functional mobility training;Stair training;Gait training;Ultrasound;Neuromuscular re-education;Patient/family education;Manual techniques;Taping;Energy conservation;Dry needling;Passive range of motion;Scar mobilization    PT Next Visit Plan  DC at this time    Consulted and Agree with Plan of Care  Patient       Patient will benefit from skilled therapeutic intervention in order  to improve the following deficits and impairments:  Decreased activity tolerance, Decreased strength,  Increased fascial restricitons, Impaired UE functional use, Pain, Decreased balance, Increased muscle spasms, Decreased range of motion, Postural dysfunction, Impaired flexibility  Visit Diagnosis: Cervicalgia  Chronic bilateral low back pain without sciatica  Other muscle spasm  Unsteadiness on feet  Dizziness and giddiness     Problem List There are no problems to display for this patient.    PHYSICAL THERAPY DISCHARGE SUMMARY  Visits from Start of Care: 15  Current functional level related to goals / functional outcomes: See above clinical impression   Remaining deficits: B LE tightness, decreased cervical and lumbar AROM, limited functional activity tolerance   Education / Equipment: HEP  Plan: Patient agrees to discharge.  Patient goals were partially met. Patient is being discharged due to the patient's request.  ?????     Janene Harvey, PT, DPT 04/24/20 3:41 PM   [facility] [address]  [place], [place], [address] Phone: [phone]   Fax:  [phone]  Name: Cindy Hunt MRN: [id number] Date of Birth: 03-Jun-1976

## 2020-07-24 ENCOUNTER — Emergency Department (HOSPITAL_BASED_OUTPATIENT_CLINIC_OR_DEPARTMENT_OTHER)
Admission: EM | Admit: 2020-07-24 | Discharge: 2020-07-24 | Disposition: A | Discharge status: Home / Self Care | Payer: BC Managed Care – PPO | Attending: Emergency Medicine | Admitting: Emergency Medicine

## 2020-07-24 ENCOUNTER — Other Ambulatory Visit: Payer: Self-pay

## 2020-07-24 ENCOUNTER — Encounter (HOSPITAL_BASED_OUTPATIENT_CLINIC_OR_DEPARTMENT_OTHER): Payer: Self-pay | Admitting: *Deleted

## 2020-07-24 DIAGNOSIS — R42 Dizziness and giddiness: Secondary | ICD-10-CM | POA: Diagnosis not present

## 2020-07-24 DIAGNOSIS — Z20822 Contact with and (suspected) exposure to covid-19: Secondary | ICD-10-CM | POA: Diagnosis not present

## 2020-07-24 DIAGNOSIS — R1084 Generalized abdominal pain: Secondary | ICD-10-CM | POA: Insufficient documentation

## 2020-07-24 DIAGNOSIS — R519 Headache, unspecified: Secondary | ICD-10-CM

## 2020-07-24 DIAGNOSIS — Z87891 Personal history of nicotine dependence: Secondary | ICD-10-CM | POA: Insufficient documentation

## 2020-07-24 HISTORY — DX: Irritable bowel syndrome, unspecified: K58.9

## 2020-07-24 HISTORY — DX: Endometriosis, unspecified: N80.9

## 2020-07-24 HISTORY — DX: Anxiety disorder, unspecified: F41.9

## 2020-07-24 LAB — SARS CORONAVIRUS 2 BY RT PCR (HOSPITAL ORDER, PERFORMED IN ~~LOC~~ HOSPITAL LAB): SARS Coronavirus 2: NEGATIVE

## 2020-07-24 MED ORDER — SODIUM CHLORIDE 0.9 % IV BOLUS
1000.0000 mL | Freq: Once | INTRAVENOUS | Status: AC
  Administered 2020-07-24: 1000 mL via INTRAVENOUS

## 2020-07-24 MED ORDER — KETOROLAC TROMETHAMINE 15 MG/ML IJ SOLN
15.0000 mg | Freq: Once | INTRAMUSCULAR | Status: AC
  Administered 2020-07-24: 15 mg via INTRAVENOUS
  Filled 2020-07-24: qty 1

## 2020-07-24 MED ORDER — METOCLOPRAMIDE HCL 5 MG/ML IJ SOLN
10.0000 mg | Freq: Once | INTRAMUSCULAR | Status: AC
  Administered 2020-07-24: 10 mg via INTRAVENOUS
  Filled 2020-07-24: qty 2

## 2020-07-24 MED ORDER — DIPHENHYDRAMINE HCL 50 MG/ML IJ SOLN
25.0000 mg | Freq: Once | INTRAMUSCULAR | Status: AC
  Administered 2020-07-24: 25 mg via INTRAVENOUS
  Filled 2020-07-24: qty 1

## 2020-07-24 NOTE — ED Provider Notes (Signed)
[facility] EMERGENCY DEPARTMENT Provider Note   CSN: [id number] Arrival date & time: 07/24/20  1337     History Chief Complaint  Patient presents with  . Headache  . Dizziness    Cindy Hunt is a 44 y.o. female.  Patient with history of endometriosis, chronic dizziness --presents the emergency department for primarily headache and elevated blood pressure today.  Patient states that she has had several days of frontal headache.  No associated light or sound sensitivity.  She has noted that her blood pressures have been into the 160/90 range at times.  This is elevated for her as her normal blood pressure is 120/80.  She has taken Tylenol and bare aspirin at home without improvement of her headache.  She denies vomiting.  No numbness, weakness, in the arms or legs.  Patient states that she has chronic "drunken" feeling when she walks over the past 2 years.  She states that she has had an MRI of the brain which was normal.  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  She states she is scheduled for vestibular testing by ENT but has not had this done yet.  She has been concerned that Prozac or progesterone have been contributing to her symptoms.  She is also concerned that she has redeveloped ovarian cysts because she has a general abdominal discomfort.        Past Medical History:  Diagnosis Date  . Anxiety   . Endometriosis   . IBS (irritable bowel syndrome)     There are no problems to display for this patient.   Past Surgical History:  Procedure Laterality Date  . NECK SURGERY       OB History   No obstetric history on file.     No family history on file.  Social History   Tobacco Use  . Smoking status: Former Games developer  . Smokeless tobacco: Never Used  Substance Use Topics  . Alcohol use: Not Currently  . Drug use: Never    Home Medications Prior to Admission medications   Medication Sig Start Date  End Date Taking? Authorizing Provider  FLUoxetine (PROZAC) 20 MG tablet Take 20 mg by mouth daily.   Yes [provider]  rosuvastatin (CRESTOR) 40 MG tablet Take 40 mg by mouth daily.   Yes [provider]    Allergies    Penicillins and Sulfa antibiotics  Review of Systems   Review of Systems  Constitutional: Negative for fever.  HENT: Negative for congestion, dental problem, rhinorrhea and sinus pressure.   Eyes: Negative for photophobia, discharge, redness and visual disturbance.  Respiratory: Negative for shortness of breath.   Cardiovascular: Negative for chest pain.  Gastrointestinal: Positive for abdominal pain (generalized) and nausea. Negative for vomiting.  Musculoskeletal: Negative for gait problem, neck pain and neck stiffness.  Skin: Negative for rash.  Neurological: Positive for dizziness (chronic) and headaches. Negative for syncope, speech difficulty, weakness, light-headedness and numbness.  Psychiatric/Behavioral: Negative for confusion.    Physical Exam Updated Vital Signs BP 124/81   Pulse 70   Temp 98.5 F (36.9 C) (Oral)   Resp 18   Ht 5\' 4"  (1.626 m)   Wt 90.7 kg   SpO2 100%   BMI 34.33 kg/m   Physical Exam Vitals and nursing note reviewed.  Constitutional:      Appearance: She is well-developed.  HENT:     Head: Normocephalic and atraumatic.  Right Ear: External ear normal.     Left Ear: External ear normal.     Nose: Nose normal.     Mouth/Throat:     Pharynx: Uvula midline.  Eyes:     General: Lids are normal.     Extraocular Movements:     Right eye: No nystagmus.     Left eye: No nystagmus.     Conjunctiva/sclera: Conjunctivae normal.     Pupils: Pupils are equal, round, and reactive to light.  Cardiovascular:     Rate and Rhythm: Normal rate and regular rhythm.  Pulmonary:     Effort: Pulmonary effort is normal.     Breath sounds: Normal breath sounds.  Abdominal:     Palpations: Abdomen is soft.      Tenderness: There is no abdominal tenderness.  Musculoskeletal:     Cervical back: Normal range of motion and neck supple. No tenderness or bony tenderness.  Skin:    General: Skin is warm and dry.  Neurological:     Mental Status: She is alert and oriented to person, place, and time.     GCS: GCS eye subscore is 4. GCS verbal subscore is 5. GCS motor subscore is 6.     Cranial Nerves: No cranial nerve deficit.     Sensory: No sensory deficit.     Coordination: Coordination normal.     Gait: Gait normal.     Deep Tendon Reflexes: Reflexes are normal and symmetric.     Comments: I observed the patient get up out of bed and ambulate to the restroom next-door without any difficulty or assistance.     ED Results / Procedures / Treatments   Labs (all labs ordered are listed, but only abnormal results are displayed) Labs Reviewed  SARS CORONAVIRUS 2 BY RT PCR (HOSPITAL ORDER, PERFORMED IN [facility])    EKG None  Radiology No results found.  Procedures Procedures (including critical care time)  Medications Ordered in ED Medications  ketorolac (TORADOL) 15 MG/ML injection 15 mg (has no administration in time range)  metoCLOPramide (REGLAN) injection 10 mg (10 mg Intravenous Given 07/24/20 1801)  diphenhydrAMINE (BENADRYL) injection 25 mg (25 mg Intravenous Given 07/24/20 1759)  sodium chloride 0.9 % bolus 1,000 mL (0 mLs Intravenous Stopped 07/24/20 1934)    ED Course  I have reviewed the triage vital signs and the nursing notes.  Pertinent labs & imaging results that were available during my care of the patient were reviewed by me and considered in my medical decision making (see chart for details).  Patient seen and examined.  Patient has several problems which will need to be followed by her PCP and specialists.  Patient agrees to migraine cocktail trial and IV fluids.  Hopefully this will help her current symptoms.  Vital signs reviewed and are as follows: BP  124/81   Pulse 70   Temp 98.5 F (36.9 C) (Oral)   Resp 18   Ht 5\' 4"  (1.626 m)   Wt 90.7 kg   SpO2 100%   BMI 34.33 kg/m   7:38 PM patient feeling slightly better after IV fluids and medication. Will give dose of Toradol and discharged home. Patient is in agreement with plan. She also requested Covid testing, ordered.     MDM Rules/Calculators/A&P                          Patient without high-risk features of headache including: sudden  onset/thunderclap HA, no similar headache in past, altered mental status, accompanying seizure, headache with exertion, age > 1, history of immunocompromise, neck or shoulder pain, fever, use of anticoagulation, family history of spontaneous SAH, concomitant drug use, toxic exposure.   Patient has a normal complete neurological exam, normal vital signs, normal level of consciousness, no signs of meningismus, is well-appearing/non-toxic appearing, no signs of trauma.   Imaging with CT/MRI not indicated given history and physical exam findings.   No dangerous or life-threatening conditions suspected or identified by history, physical exam, and by work-up. No indications for hospitalization identified.    Final Clinical Impression(s) / ED Diagnoses Final diagnoses:  Acute nonintractable headache, unspecified headache type  Lightheadedness    Rx / DC Orders ED Discharge Orders    None       Renne Crigler, PA-C 07/24/20 Ninfa Linden    Arby Barrette, MD 07/30/20 1156

## 2020-07-24 NOTE — ED Triage Notes (Signed)
2 days of HTN, headache, dizziness, neck pain. Off balance problem x 2 years. Her MD's cannot find why.

## 2020-07-24 NOTE — ED Notes (Signed)
Pt. Reports multiple complaints with the main issue being she is having headache.

## 2020-07-24 NOTE — Discharge Instructions (Signed)
Please read and follow all provided instructions.  Your diagnoses today include:  1. Acute nonintractable headache, unspecified headache type   2. Lightheadedness     Tests performed today include:  Vital signs. See below for your results today.   COVID test - you can find your results on MyChart  Medications:  In the Emergency Department you received:  Reglan - antinausea/headache medication  Benadryl - antihistamine to counteract potential side effects of reglan  Toradol - NSAID medication similar to ibuprofen  Take any prescribed medications only as directed.  Additional information:  Follow any educational materials contained in this packet.  You are having a headache. No specific cause was found today for your headache. It may have been a migraine or other cause of headache. Stress, anxiety, fatigue, and depression are common triggers for headaches.   Your headache today does not appear to be life-threatening or require hospitalization, but often the exact cause of headaches is not determined in the emergency department. Therefore, follow-up with your doctor is very important to find out what may have caused your headache and whether or not you need any further diagnostic testing or treatment.   Sometimes headaches can appear benign (not harmful), but then more serious symptoms can develop which should prompt an immediate re-evaluation by your doctor or the emergency department.  BE VERY CAREFUL not to take multiple medicines containing Tylenol (also called acetaminophen). Doing so can lead to an overdose which can damage your liver and cause liver failure and possibly death.   Follow-up instructions: Please follow-up with your primary care provider in the next 3 days for further evaluation of your symptoms.   Return instructions:   Please return to the Emergency Department if you experience worsening symptoms.  Return if the medications do not resolve your headache, if  it recurs, or if you have multiple episodes of vomiting or cannot keep down fluids.  Return if you have a change from the usual headache.  RETURN IMMEDIATELY IF you:  Develop a sudden, severe headache  Develop confusion or become poorly responsive or faint  Develop a fever above 100.30F or problem breathing  Have a change in speech, vision, swallowing, or understanding  Develop new weakness, numbness, tingling, incoordination in your arms or legs  Have a seizure  Please return if you have any other emergent concerns.  Additional Information:  Your vital signs today were: BP 124/81   Pulse 70   Temp 98.5 F (36.9 C) (Oral)   Resp 18   Ht 5\' 4"  (1.626 m)   Wt 90.7 kg   SpO2 100%   BMI 34.33 kg/m  If your blood pressure (BP) was elevated above 135/85 this visit, please have this repeated by your doctor within one month. --------------

## 2020-07-24 NOTE — ED Notes (Signed)
Pt. Walked steady gait to room 7 down hall way.  RN watched Pt. Walk down the hallway. Pt. Speaks clear speech and intact.

## 2021-06-16 ENCOUNTER — Other Ambulatory Visit: Payer: Self-pay

## 2021-06-16 ENCOUNTER — Emergency Department (HOSPITAL_BASED_OUTPATIENT_CLINIC_OR_DEPARTMENT_OTHER)
Admission: EM | Admit: 2021-06-16 | Discharge: 2021-06-16 | Disposition: A | Discharge status: Home / Self Care | Payer: BC Managed Care – PPO | Attending: Emergency Medicine | Admitting: Emergency Medicine

## 2021-06-16 ENCOUNTER — Encounter (HOSPITAL_BASED_OUTPATIENT_CLINIC_OR_DEPARTMENT_OTHER): Payer: Self-pay | Admitting: Emergency Medicine

## 2021-06-16 DIAGNOSIS — R519 Headache, unspecified: Secondary | ICD-10-CM | POA: Diagnosis not present

## 2021-06-16 DIAGNOSIS — Z87891 Personal history of nicotine dependence: Secondary | ICD-10-CM | POA: Diagnosis not present

## 2021-06-16 DIAGNOSIS — R112 Nausea with vomiting, unspecified: Secondary | ICD-10-CM | POA: Diagnosis not present

## 2021-06-16 DIAGNOSIS — Z20822 Contact with and (suspected) exposure to covid-19: Secondary | ICD-10-CM | POA: Insufficient documentation

## 2021-06-16 DIAGNOSIS — R0981 Nasal congestion: Secondary | ICD-10-CM | POA: Diagnosis not present

## 2021-06-16 LAB — CBC WITH DIFFERENTIAL/PLATELET
Abs Immature Granulocytes: 0.02 10*3/uL (ref 0.00–0.07)
Basophils Absolute: 0 10*3/uL (ref 0.0–0.1)
Basophils Relative: 0 %
Eosinophils Absolute: 0 10*3/uL (ref 0.0–0.5)
Eosinophils Relative: 0 %
HCT: 39.5 % (ref 36.0–46.0)
Hemoglobin: 13.3 g/dL (ref 12.0–15.0)
Immature Granulocytes: 0 %
Lymphocytes Relative: 25 %
Lymphs Abs: 1.7 10*3/uL (ref 0.7–4.0)
MCH: 30.4 pg (ref 26.0–34.0)
MCHC: 33.7 g/dL (ref 30.0–36.0)
MCV: 90.2 fL (ref 80.0–100.0)
Monocytes Absolute: 0.4 10*3/uL (ref 0.1–1.0)
Monocytes Relative: 6 %
Neutro Abs: 4.8 10*3/uL (ref 1.7–7.7)
Neutrophils Relative %: 69 %
Platelets: 328 10*3/uL (ref 150–400)
RBC: 4.38 MIL/uL (ref 3.87–5.11)
RDW: 12.1 % (ref 11.5–15.5)
WBC: 7 10*3/uL (ref 4.0–10.5)
nRBC: 0 % (ref 0.0–0.2)

## 2021-06-16 LAB — RESP PANEL BY RT-PCR (FLU A&B, COVID) ARPGX2
Influenza A by PCR: NEGATIVE
Influenza B by PCR: NEGATIVE
SARS Coronavirus 2 by RT PCR: NEGATIVE

## 2021-06-16 LAB — COMPREHENSIVE METABOLIC PANEL
ALT: 21 U/L (ref 0–44)
AST: 21 U/L (ref 15–41)
Albumin: 3.7 g/dL (ref 3.5–5.0)
Alkaline Phosphatase: 48 U/L (ref 38–126)
Anion gap: 6 (ref 5–15)
BUN: 5 mg/dL — ABNORMAL LOW (ref 6–20)
CO2: 24 mmol/L (ref 22–32)
Calcium: 8.9 mg/dL (ref 8.9–10.3)
Chloride: 104 mmol/L (ref 98–111)
Creatinine, Ser: 0.72 mg/dL (ref 0.44–1.00)
GFR, Estimated: >60 mL/min (ref >60)
Glucose, Bld: 99 mg/dL (ref 70–99)
Potassium: 3.8 mmol/L (ref 3.5–5.1)
Sodium: 134 mmol/L — ABNORMAL LOW (ref 135–145)
Total Bilirubin: 0.5 mg/dL (ref 0.3–1.2)
Total Protein: 7.7 g/dL (ref 6.5–8.1)

## 2021-06-16 MED ORDER — AZITHROMYCIN 250 MG PO TABS: 250.0000 mg | ORAL_TABLET | Freq: Every day | ORAL | 0 refills | Status: AC

## 2021-06-16 MED ORDER — ONDANSETRON 4 MG PO TBDP: 4.0000 mg | ORAL_TABLET | Freq: Three times a day (TID) | ORAL | 0 refills | Status: AC | PRN

## 2021-06-16 MED ORDER — SODIUM CHLORIDE 0.9 % IV BOLUS
1000.0000 mL | Freq: Once | INTRAVENOUS | Status: AC
  Administered 2021-06-16: 1000 mL via INTRAVENOUS

## 2021-06-16 MED ORDER — METOCLOPRAMIDE HCL 5 MG/ML IJ SOLN
10.0000 mg | Freq: Once | INTRAMUSCULAR | Status: AC
  Administered 2021-06-16: 10 mg via INTRAVENOUS
  Filled 2021-06-16: qty 2

## 2021-06-16 MED ORDER — KETOROLAC TROMETHAMINE 30 MG/ML IJ SOLN
30.0000 mg | Freq: Once | INTRAMUSCULAR | Status: AC
  Administered 2021-06-16: 30 mg via INTRAVENOUS
  Filled 2021-06-16: qty 1

## 2021-06-16 MED ORDER — DIPHENHYDRAMINE HCL 50 MG/ML IJ SOLN
INTRAMUSCULAR | Status: AC
  Administered 2021-06-16: 25 mg via INTRAVENOUS
  Filled 2021-06-16: qty 1

## 2021-06-16 MED ORDER — DIPHENHYDRAMINE HCL 50 MG/ML IJ SOLN: 25.0000 mg | Freq: Once | INTRAMUSCULAR | Status: AC

## 2021-06-16 MED ORDER — DEXAMETHASONE SODIUM PHOSPHATE 4 MG/ML IJ SOLN
4.0000 mg | Freq: Once | INTRAMUSCULAR | Status: AC
  Administered 2021-06-16: 4 mg via INTRAVENOUS
  Filled 2021-06-16: qty 1

## 2021-06-16 MED ORDER — DIPHENHYDRAMINE HCL 25 MG PO CAPS: 25.0000 mg | ORAL_CAPSULE | Freq: Once | ORAL | Status: DC

## 2021-06-16 NOTE — ED Provider Notes (Signed)
[facility] EMERGENCY DEPARTMENT Provider Note   CSN: [id number] Arrival date & time: 06/16/21  1011     History Chief Complaint  Patient presents with   Headache   Vomiting    Cindy Hunt is a 45 y.o. female.  HPI Patient is a 45 year old female with past medical history significant for anxiety, endometriosis, IBS  She is presenting today for sinus congestion, she states that her symptoms of been going on for approximately 1 week but states that she is had pressure over the past 3 days that is severe achy constant and does not seem to be improved by the fluticasone nasal spray that her PCP provided her.  She states that she took Sudafed last night which had some improvement on her symptoms but states that she woke this morning with continued pain.  She has not had any fevers no blood from her sinuses.  No chest pain cough lightheadedness or dizziness.  She also has had several episodes of emesis which were nonbloody nonbilious she states she had 3-4 yesterday but only nausea today.   She denies any weakness or numbness.  She states the headache/facial pressure came on gradually over this past week.  Symptoms have worsened over the past 3 days.  She denies any slurred speech or confusion.  No head trauma.      Past Medical History:  Diagnosis Date   Anxiety    Endometriosis    IBS (irritable bowel syndrome)     There are no problems to display for this patient.   Past Surgical History:  Procedure Laterality Date   NECK SURGERY       OB History   No obstetric history on file.     History reviewed. No pertinent family history.  Social History   Tobacco Use   Smoking status: Former    Pack years: 0.00   Smokeless tobacco: Never  Substance Use Topics   Alcohol use: Not Currently   Drug use: Never    Home Medications Prior to Admission medications   Medication Sig Start Date End Date Taking? Authorizing Provider  azithromycin (ZITHROMAX Z-PAK)  250 MG tablet Take 1 tablet (250 mg total) by mouth daily. Take two tablets together for the first dose 06/16/21  Yes Waylon Koffler S, PA  FLUoxetine (PROZAC) 20 MG tablet Take 20 mg by mouth daily.    [provider]  MINASTRIN 24 FE 1-20 MG-MCG(24) CHEW Chew 1 tablet by mouth daily. 06/12/21   [provider]  rosuvastatin (CRESTOR) 40 MG tablet Take 40 mg by mouth daily.    [provider]    Allergies    Penicillins and Sulfa antibiotics  Review of Systems   Review of Systems  Constitutional:  Negative for chills and fever.  HENT:  Positive for congestion.   Eyes:  Negative for pain.  Respiratory:  Negative for cough and shortness of breath.   Cardiovascular:  Negative for chest pain and leg swelling.  Gastrointestinal:  Negative for abdominal pain and vomiting.  Genitourinary:  Negative for dysuria.  Musculoskeletal:  Negative for myalgias.  Skin:  Negative for rash.  Neurological:  Positive for headaches. Negative for dizziness.   Physical Exam Updated Vital Signs BP 137/89   Pulse 78   Temp 99.1 F (37.3 C)   Resp 18   Ht 5\' 4"  (1.626 m)   Wt 86.6 kg   LMP 05/13/2021 (Approximate)   SpO2 100%   BMI 32.79 kg/m   Physical Exam  Vitals and nursing note reviewed.  Constitutional:      General: She is not in acute distress. HENT:     Head: Normocephalic and atraumatic.     Comments: Notable left maxillary tenderness to palpation.  No frontal sinus tenderness to palpation.  Extraocular movements are intact.    Nose: Nose normal.  Eyes:     General: No scleral icterus. Neck:     Comments: No cervical lymphadenopathy Cardiovascular:     Rate and Rhythm: Normal rate and regular rhythm.     Pulses: Normal pulses.     Heart sounds: Normal heart sounds.  Pulmonary:     Effort: Pulmonary effort is normal. No respiratory distress.     Breath sounds: No wheezing.  Abdominal:     Palpations: Abdomen is soft.     Tenderness: There is no  abdominal tenderness.  Musculoskeletal:     Cervical back: Normal range of motion and neck supple.     Right lower leg: No edema.     Left lower leg: No edema.  Skin:    General: Skin is warm and dry.     Capillary Refill: Capillary refill takes less than 2 seconds.  Neurological:     Mental Status: She is alert. Mental status is at baseline.     Comments: Moves all 4 extremities.  Grip strength 5/5 bilaterally.  Sensation in all 4 extremities  Psychiatric:        Mood and Affect: Mood normal.        Behavior: Behavior normal.    ED Results / Procedures / Treatments   Labs (all labs ordered are listed, but only abnormal results are displayed) Labs Reviewed  COMPREHENSIVE METABOLIC PANEL - Abnormal; Notable for the following components:      Result Value   Sodium 134 (*)    BUN 5 (*)    All other components within normal limits  RESP PANEL BY RT-PCR (FLU A&B, COVID) ARPGX2  CBC WITH DIFFERENTIAL/PLATELET    EKG None  Radiology No results found.  Procedures Procedures   Medications Ordered in ED Medications  ketorolac (TORADOL) 30 MG/ML injection 30 mg (has no administration in time range)  sodium chloride 0.9 % bolus 1,000 mL (0 mLs Intravenous Stopped 06/16/21 1222)  metoCLOPramide (REGLAN) injection 10 mg (10 mg Intravenous Given 06/16/21 1125)  dexamethasone (DECADRON) injection 4 mg (4 mg Intravenous Given 06/16/21 1125)  diphenhydrAMINE (BENADRYL) injection 25 mg (25 mg Intravenous Given 06/16/21 1124)    ED Course  I have reviewed the triage vital signs and the nursing notes.  Pertinent labs & imaging results that were available during my care of the patient were reviewed by me and considered in my medical decision making (see chart for details).  Clinical Course as of 06/16/21 1247  Sat Jun 16, 2021  1146 WBC: 7.0 No leukocytosis or anemia [WF]    Clinical Course User Index [WF] Gailen Shelter, [place]   MDM Rules/Calculators/A&P                           Patient with sinus headache for 1 week seems that she is having significant symptoms.  She is neurologically intact and well-appearing ultimately with normal vital signs.  She is having minimal relief with Flonase.  She has focal maxillary sinus tenderness to palpation on left side.  COVID influenza negative CBC and CMP unremarkable.  Patient's headache is mildly improved with Reglan Benadryl Decadron we  will add on 1 dose of Toradol and discharged home.  She has had no additional episodes of vomiting she was p.o. challenged and tolerated p.o. without difficulty.  Will discharge home with Zofran for nausea as well.  Overall she is quite well-appearing suspect her vomiting episodes are perhaps related to pain.  I am covering with azithromycin given that patient states that this worked for her in the past.  She states that she has been is allergic and would not like to try Augmentin although her penicillin allergy was as a child to penicillin when she had a mononucleosis infection.  Doubt dural venous thrombosis or other emergent headache/sinus related disease.  Patient given return precautions and is understanding of plan and instructions.  Final Clinical Impression(s) / ED Diagnoses Final diagnoses:  Sinus headache    Rx / DC Orders ED Discharge Orders          Ordered    azithromycin (ZITHROMAX Z-PAK) 250 MG tablet  Daily        06/16/21 1239             Gailen Shelter, [place] 06/16/21 1249    Virgina Norfolk, DO 06/16/21 1306

## 2021-06-16 NOTE — Discharge Instructions (Addendum)
Please is a Nettie pot as we discussed.  I also recommend to continue his Flonase.  Please alternate Tylenol and ibuprofen as discussed below.  I have printed the azithromycin / zpack but recommend waiting to see if your symptoms improve by Monday if they do not improve by Monday you may fill the antibiotic please stay for the entire course to be began.  Please use Tylenol or ibuprofen for pain.  You may use 600 mg ibuprofen every 6 hours or 1000 mg of Tylenol every 6 hours.  You may choose to alternate between the 2.  This would be most effective.  Not to exceed 4 g of Tylenol within 24 hours.  Not to exceed 3200 mg ibuprofen 24 hours.   Use benadryl 25-50 mg at bedtime

## 2021-06-16 NOTE — ED Triage Notes (Signed)
Pt arrives pov endorses concern for "sinus infection" x  1 week, worsening last 3 days. Seen by PCP and received nasal spray. Pt endorses "head pressure". Took max sudafed last night with some relief. Pt denies fever, endorses emesis yesterday, nausea today. VAN negative

## 2021-06-29 ENCOUNTER — Other Ambulatory Visit: Payer: Self-pay | Admitting: Geriatric Medicine

## 2021-06-29 ENCOUNTER — Other Ambulatory Visit: Payer: Self-pay | Admitting: Dentistry

## 2021-06-29 DIAGNOSIS — M26633 Articular disc disorder of bilateral temporomandibular joint: Secondary | ICD-10-CM

## 2021-06-29 DIAGNOSIS — R52 Pain, unspecified: Secondary | ICD-10-CM

## 2021-06-29 DIAGNOSIS — R519 Headache, unspecified: Secondary | ICD-10-CM

## 2021-07-08 ENCOUNTER — Other Ambulatory Visit: Payer: BC Managed Care – PPO

## 2021-07-21 ENCOUNTER — Other Ambulatory Visit: Payer: Self-pay

## 2021-07-21 ENCOUNTER — Ambulatory Visit
Admission: RE | Admit: 2021-07-21 | Discharge: 2021-07-21 | Disposition: A | Discharge status: Home / Self Care | Payer: BC Managed Care – PPO | Source: Ambulatory Visit | Attending: Dentistry | Admitting: Dentistry

## 2021-07-21 DIAGNOSIS — R519 Headache, unspecified: Secondary | ICD-10-CM

## 2021-07-21 DIAGNOSIS — R52 Pain, unspecified: Secondary | ICD-10-CM

## 2021-07-21 DIAGNOSIS — M26633 Articular disc disorder of bilateral temporomandibular joint: Secondary | ICD-10-CM

## 2021-07-25 IMAGING — MR MR [PERSON_NAME]
12 series · 16 of 16 positions shown · non-contrast
Comparison: None.

CLINICAL DATA: Sharp pain for 15 years.  Right greater than left.

EXAM:
MRI OF TEMPOROMANDIBULAR JOINT WITHOUT CONTRAST
TECHNIQUE: Multiplanar, multisequence MR imaging of the temporomandibular joint
was performed following the standard protocol. No intravenous
contrast was administered.

[Series 4: T1 · axial · 4.0mm · 0.59mm/px · z∈[-32,+16]mm · 2 of 13 slices shown (1 of 2)]
[im 1/13]
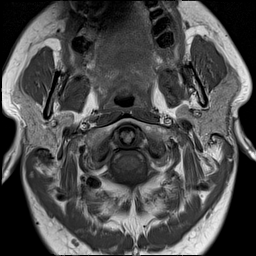
[im 13/13]
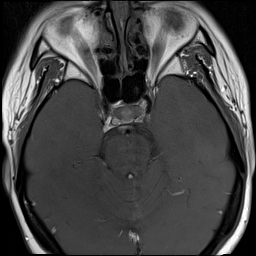

[Series 5: T2 fat-sat · sagittal · 4.0mm · 0.62mm/px · 2 of 11 slices shown (1 of 2)]
[im 1/11]
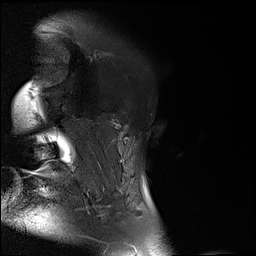
[im 11/11]
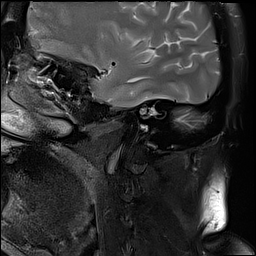

[Series 6: PD · coronal · 4.0mm · 0.44mm/px · 2 of 13 slices shown (1 of 8)]
[im 1/13]
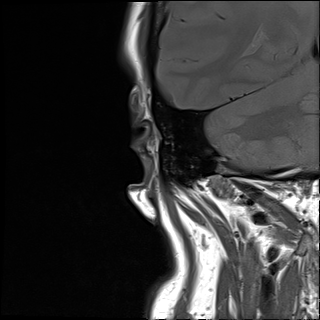
[im 13/13]
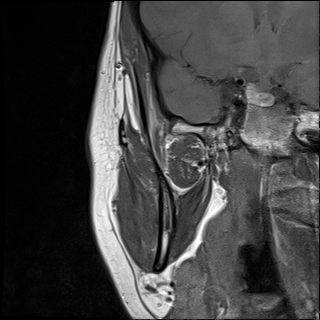

[Series 7: PD · coronal · 4.0mm · 0.44mm/px · 2 of 13 slices shown (2 of 8)]
[im 1/13]
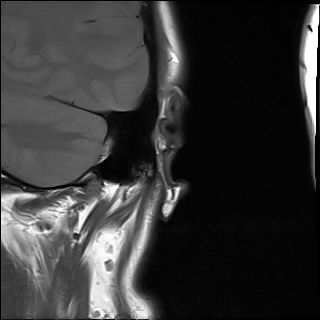
[im 13/13]
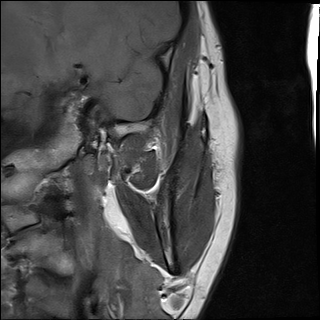

[Series 8: T2 fat-sat · sagittal · 4.0mm · 0.62mm/px · 1 of 11 slices shown (2 of 2)]
[im 1/11]
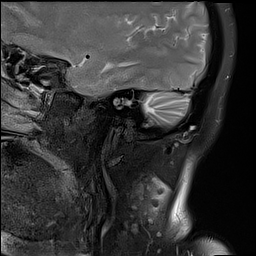

[Series 9: PD · sagittal · 4.0mm · 0.44mm/px · 1 of 11 slices shown (3 of 8)]
[im 1/11]
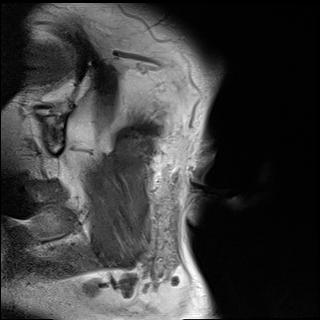

[Series 10: PD · sagittal · 4.0mm · 0.44mm/px · 1 of 9 slices shown (4 of 8)]
[im 1/9]
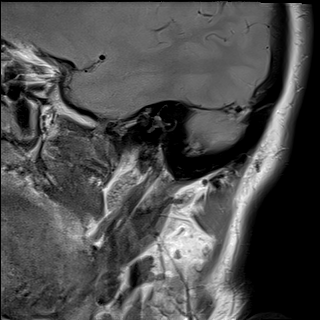

[Series 11: T1 · axial · 4.0mm · 0.31mm/px · 1 of 13 slices shown (2 of 2)]
[im 1/13]
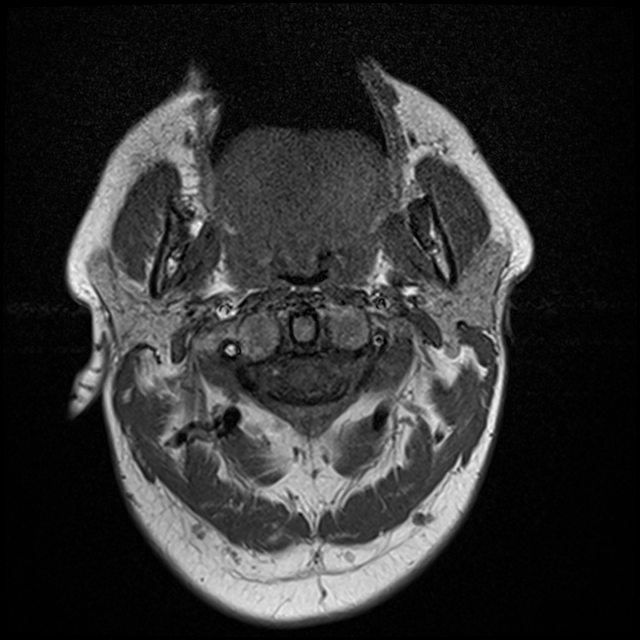

[Series 12: PD · sagittal · 4.0mm · 0.44mm/px · 1 of 11 slices shown (5 of 8)]
[im 1/11]
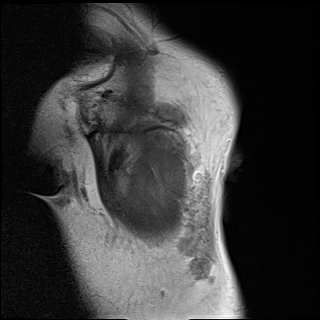

[Series 13: PD · sagittal · 4.0mm · 0.44mm/px · 1 of 11 slices shown (6 of 8)]
[im 1/11]
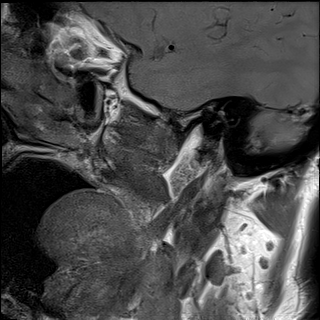

[Series 14: PD · coronal · 4.0mm · 0.44mm/px · 1 of 13 slices shown (7 of 8)]
[im 1/13]
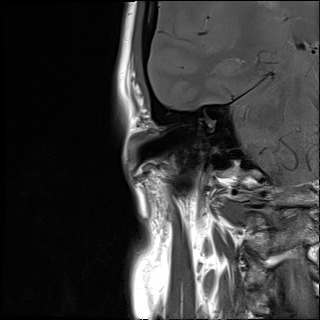

[Series 15: PD · coronal · 4.0mm · 0.44mm/px · 1 of 13 slices shown (8 of 8)]
[im 1/13]
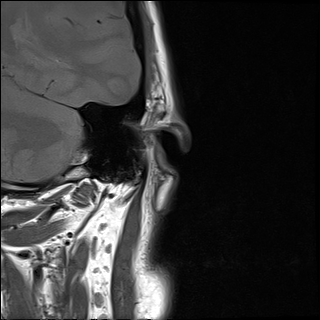

[16 of 16 positions shown; findings below may reference images not displayed]

FINDINGS: Right temporomandibular joint: Articular disc is normally positioned
between the mandibular condyle and the temporal bone in both open
and closed positions. Mild degeneration of the posterior band of the
articular disc. Normal anterior translation of the mandibular
condyle with jaw opening. No joint effusion. No erosive changes.

Left temporomandibular joint: Articular disc is normally positioned
between the mandibular condyle and the temporal bone in both open
and closed positions. Mild degeneration of the posterior band of the
articular disc. Normal anterior translation of the mandibular
condyle with jaw opening. No joint effusion. No erosive changes.

Other: Mild mucosal thickening the maxillary sinuses bilaterally.
Visualized brain demonstrate no focal abnormality
IMPRESSION: 1. Normal position of the articular disc and normal translation of
the mandibular condyle. No arthropathy of bilateral
temporomandibular joints.
